# Patient Record
Sex: Male | Born: 1965 | Hispanic: Yes | Marital: Married | Smoking: Never smoker
Health system: Southern US, Community
[De-identification: ages and names within clinical notes are randomized; demographics above are authoritative.]

## PROBLEM LIST (undated history)

## (undated) DIAGNOSIS — E119 Type 2 diabetes mellitus without complications: Secondary | ICD-10-CM

## (undated) DIAGNOSIS — I10 Essential (primary) hypertension: Secondary | ICD-10-CM

## (undated) HISTORY — PX: APPENDECTOMY: SHX54

---

## 2013-06-25 ENCOUNTER — Encounter (HOSPITAL_COMMUNITY): Payer: Self-pay | Admitting: Emergency Medicine

## 2013-06-25 ENCOUNTER — Emergency Department (HOSPITAL_COMMUNITY)
Admission: EM | Admit: 2013-06-25 | Discharge: 2013-06-25 | Disposition: A | Discharge status: Home / Self Care | Payer: Medicaid Other | Attending: Emergency Medicine | Admitting: Emergency Medicine

## 2013-06-25 ENCOUNTER — Emergency Department (HOSPITAL_COMMUNITY): Payer: Medicaid Other

## 2013-06-25 DIAGNOSIS — S93409A Sprain of unspecified ligament of unspecified ankle, initial encounter: Secondary | ICD-10-CM | POA: Insufficient documentation

## 2013-06-25 DIAGNOSIS — X500XXA Overexertion from strenuous movement or load, initial encounter: Secondary | ICD-10-CM | POA: Insufficient documentation

## 2013-06-25 DIAGNOSIS — Y9301 Activity, walking, marching and hiking: Secondary | ICD-10-CM | POA: Insufficient documentation

## 2013-06-25 DIAGNOSIS — E119 Type 2 diabetes mellitus without complications: Secondary | ICD-10-CM | POA: Insufficient documentation

## 2013-06-25 DIAGNOSIS — S93401A Sprain of unspecified ligament of right ankle, initial encounter: Secondary | ICD-10-CM

## 2013-06-25 DIAGNOSIS — R296 Repeated falls: Secondary | ICD-10-CM | POA: Insufficient documentation

## 2013-06-25 DIAGNOSIS — Y9229 Other specified public building as the place of occurrence of the external cause: Secondary | ICD-10-CM | POA: Insufficient documentation

## 2013-06-25 HISTORY — DX: Type 2 diabetes mellitus without complications: E11.9

## 2013-06-25 MED ORDER — IBUPROFEN 800 MG PO TABS
800.0000 mg | ORAL_TABLET | Freq: Once | ORAL | Status: AC
  Administered 2013-06-25: 800 mg via ORAL
  Filled 2013-06-25: qty 1

## 2013-06-25 MED ORDER — IBUPROFEN 600 MG PO TABS: 600.0000 mg | ORAL_TABLET | Freq: Four times a day (QID) | ORAL | Status: DC | PRN

## 2013-06-25 NOTE — ED Notes (Signed)
Patient transported to X-ray 

## 2013-06-25 NOTE — ED Notes (Signed)
Pt was at a restaurant when pt rolled his right ankle. Pt c/o pain about 6/10 and worse with weight bearing. Right ankle does appear to be swollen and is larger than left ankle.

## 2013-06-25 NOTE — ED Provider Notes (Signed)
History    CSN: [id number] Arrival date & time 06/25/13  1011  First MD Initiated Contact with Patient 06/25/13 1012     Chief Complaint  Patient presents with  . Foot Injury  . Fall   (Consider location/radiation/quality/duration/timing/severity/associated sxs/prior Treatment) HPI Comments: 47 year old male presents for right ankle pain onset yesterday. Patient states that when he was walking he rolled his ankle last night at a restaurant. Patient woke up this morning with increased pain and swelling of his right ankle. Pain is worse with weightbearing and palpation and improved with rest and elevation. He admits to tingling sensation in his right ankle. No fevers, pallor, erythema, numbness, or extremity weakness.  Patient is a 47 y.o. male presenting with foot injury and fall. The history is provided by the patient. The history is limited by a language barrier. A language interpreter was used (daughter).  Foot Injury Location:  Ankle Time since incident:  2 days Injury: yes (patient rolled ankle while in a restaurant yesterday)   Ankle location:  R ankle Pain details:    Quality:  Aching   Radiates to:  Does not radiate   Severity:  Mild   Onset quality:  Gradual   Timing:  Intermittent   Progression:  Unchanged Chronicity:  New Dislocation: no   Prior injury to area:  No Relieved by:  Nothing Worsened by:  Bearing weight and flexion Ineffective treatments:  None tried Associated symptoms: swelling and tingling   Associated symptoms: no decreased ROM, no fever, no muscle weakness and no numbness   Fall Associated symptoms include arthralgias and joint swelling. Pertinent negatives include no fever, numbness or weakness.   Past Medical History  Diagnosis Date  . Diabetes mellitus without complication    Past Surgical History  Procedure Laterality Date  . Appendectomy     No family history on file. History  Substance Use Topics  . Smoking status: Never Smoker   .  Smokeless tobacco: Not on file  . Alcohol Use: Yes    Review of Systems  Constitutional: Negative for fever.  Musculoskeletal: Positive for joint swelling and arthralgias.  Skin: Negative for color change and pallor.  Neurological: Negative for weakness and numbness.  All other systems reviewed and are negative.    Allergies  Review of patient's allergies indicates no known allergies.  Home Medications   Current Outpatient Rx  Name  Route  Sig  Dispense  Refill  . ibuprofen (ADVIL,MOTRIN) 600 MG tablet   Oral   Take 1 tablet (600 mg total) by mouth every 6 (six) hours as needed for pain.   30 tablet   0    BP 149/73  Pulse 77  Temp(Src) 97.9 F (36.6 C) (Oral)  Resp 18  Ht 5\' 6"  (1.676 m)  SpO2 100%  Physical Exam  Nursing note and vitals reviewed. Constitutional: He is oriented to person, place, and time. He appears well-developed and well-nourished. No distress.  HENT:  Head: Normocephalic and atraumatic.  Eyes: Conjunctivae and EOM are normal. No scleral icterus.  Neck: Normal range of motion.  Cardiovascular: Normal rate, regular rhythm and intact distal pulses.   Dorsalis pedis and posterior tibial pulses 2+ bilaterally. Capillary refill normal.  Pulmonary/Chest: Effort normal. No respiratory distress.  Musculoskeletal:       Right ankle: He exhibits swelling. He exhibits normal range of motion, no ecchymosis, no laceration and normal pulse. Tenderness. Lateral malleolus tenderness found. Achilles tendon normal.       Right foot:  Normal.       Feet:  Neurological: He is alert and oriented to person, place, and time.  Skin: Skin is warm and dry. No rash noted. He is not diaphoretic. No erythema. No pallor.  Psychiatric: He has a normal mood and affect. His behavior is normal.    ED Course  Procedures (including critical care time) Labs Reviewed - No data to display Dg Ankle Complete Right  06/25/2013   *RADIOLOGY REPORT*  Clinical Data: Fall, right ankle  pain/swelling  RIGHT ANKLE - COMPLETE 3+ VIEW  Comparison: None.  Findings: No fracture or dislocation is seen.  The ankle mortise is intact.  Well corticated osseous density inferior to the lateral malleolus, not acute.  The base of the fifth metatarsal is unremarkable.  Small plantar and posterior calcaneal enthesophytes.  Mild lateral soft tissue swelling.  IMPRESSION: No fracture or dislocation is seen.  Mild lateral soft tissue swelling.   Original Report Authenticated By: Charline Bills, M.D.    1. Ankle sprain, right, initial encounter     MDM  Uncomplicated ankle sprain. Patient neurovascularly intact. Physical exam as above. There is no pallor, pulselessness, poikilothermia, or paresthesias to suspect complicating injury. No erythema or heat-to-touch to suspect underlying cellulitic or infectious joint process. X-ray without evidence of fracture or dislocation. ASO ankle applied in ED and crutches provided for weightbearing as tolerated. Patient appropriate for discharge with RICE instruction an orthopedic referral if symptoms do not improve in one week. Indications for return provided and patient agreeable to plan.     Antony Madura, PA-C 06/25/13 1121

## 2013-07-01 NOTE — ED Provider Notes (Signed)
Medical screening examination/treatment/procedure(s) were conducted as a shared visit with non-physician practitioner(s) or resident  and myself.  I personally evaluated the patient during the encounter and agree with the findings and plan unless otherwise indicated. Low risk injury.  Mild pain on exam. No fx on xray.   Enid Skeens, MD 07/01/13 2051

## 2013-07-18 ENCOUNTER — Encounter (HOSPITAL_COMMUNITY): Payer: Self-pay | Admitting: *Deleted

## 2013-07-18 DIAGNOSIS — E669 Obesity, unspecified: Secondary | ICD-10-CM | POA: Diagnosis present

## 2013-07-18 DIAGNOSIS — Z6841 Body Mass Index (BMI) 40.0 and over, adult: Secondary | ICD-10-CM

## 2013-07-18 DIAGNOSIS — E860 Dehydration: Secondary | ICD-10-CM | POA: Diagnosis present

## 2013-07-18 DIAGNOSIS — E1142 Type 2 diabetes mellitus with diabetic polyneuropathy: Secondary | ICD-10-CM | POA: Diagnosis present

## 2013-07-18 DIAGNOSIS — I129 Hypertensive chronic kidney disease with stage 1 through stage 4 chronic kidney disease, or unspecified chronic kidney disease: Secondary | ICD-10-CM | POA: Diagnosis present

## 2013-07-18 DIAGNOSIS — E1149 Type 2 diabetes mellitus with other diabetic neurological complication: Principal | ICD-10-CM | POA: Diagnosis present

## 2013-07-18 DIAGNOSIS — R079 Chest pain, unspecified: Secondary | ICD-10-CM | POA: Diagnosis present

## 2013-07-18 DIAGNOSIS — Z794 Long term (current) use of insulin: Secondary | ICD-10-CM

## 2013-07-18 DIAGNOSIS — N179 Acute kidney failure, unspecified: Secondary | ICD-10-CM | POA: Diagnosis present

## 2013-07-18 DIAGNOSIS — N390 Urinary tract infection, site not specified: Secondary | ICD-10-CM | POA: Diagnosis present

## 2013-07-18 DIAGNOSIS — B961 Klebsiella pneumoniae [K. pneumoniae] as the cause of diseases classified elsewhere: Secondary | ICD-10-CM | POA: Diagnosis present

## 2013-07-18 DIAGNOSIS — N189 Chronic kidney disease, unspecified: Secondary | ICD-10-CM | POA: Diagnosis present

## 2013-07-18 NOTE — ED Notes (Signed)
The pt is c/o some chest pressure and his tongue is numb tonight.  He is  A diabetic and they think his blood sugar is high but they have not checked it

## 2013-07-18 NOTE — ED Notes (Signed)
CBG checked 300

## 2013-07-19 ENCOUNTER — Emergency Department (HOSPITAL_COMMUNITY): Payer: Medicaid Other

## 2013-07-19 ENCOUNTER — Inpatient Hospital Stay (HOSPITAL_COMMUNITY)
Admission: EM | Admit: 2013-07-19 | Discharge: 2013-07-21 | DRG: 074 | Disposition: A | Discharge status: Home / Self Care | Payer: Medicaid Other | Attending: Family Medicine | Admitting: Family Medicine

## 2013-07-19 ENCOUNTER — Observation Stay (HOSPITAL_COMMUNITY): Payer: Medicaid Other

## 2013-07-19 DIAGNOSIS — E119 Type 2 diabetes mellitus without complications: Secondary | ICD-10-CM

## 2013-07-19 DIAGNOSIS — I10 Essential (primary) hypertension: Secondary | ICD-10-CM | POA: Diagnosis present

## 2013-07-19 DIAGNOSIS — D72829 Elevated white blood cell count, unspecified: Secondary | ICD-10-CM

## 2013-07-19 DIAGNOSIS — R072 Precordial pain: Secondary | ICD-10-CM

## 2013-07-19 DIAGNOSIS — N390 Urinary tract infection, site not specified: Secondary | ICD-10-CM

## 2013-07-19 DIAGNOSIS — R079 Chest pain, unspecified: Secondary | ICD-10-CM | POA: Diagnosis present

## 2013-07-19 HISTORY — DX: Essential (primary) hypertension: I10

## 2013-07-19 LAB — CBC WITH DIFFERENTIAL/PLATELET
Basophils Relative: 0 % (ref 0–1)
Eosinophils Relative: 1 % (ref 0–5)
Hemoglobin: 11.1 g/dL — ABNORMAL LOW (ref 13.0–17.0)
Lymphs Abs: 3.7 10*3/uL (ref 0.7–4.0)
MCV: 86.5 fL (ref 78.0–100.0)
Monocytes Relative: 9 % (ref 3–12)
Neutrophils Relative %: 72 % (ref 43–77)
Platelets: 222 10*3/uL (ref 150–400)
RBC: 3.64 MIL/uL — ABNORMAL LOW (ref 4.22–5.81)
WBC: 20.6 10*3/uL — ABNORMAL HIGH (ref 4.0–10.5)

## 2013-07-19 LAB — COMPREHENSIVE METABOLIC PANEL
ALT: 14 U/L (ref 0–53)
Alkaline Phosphatase: 73 U/L (ref 39–117)
BUN: 38 mg/dL — ABNORMAL HIGH (ref 6–23)
CO2: 25 mEq/L (ref 19–32)
GFR calc Af Amer: 48 mL/min — ABNORMAL LOW (ref >90)
GFR calc non Af Amer: 41 mL/min — ABNORMAL LOW (ref >90)
Glucose, Bld: 309 mg/dL — ABNORMAL HIGH (ref 70–99)
Potassium: 4.2 mEq/L (ref 3.5–5.1)
Sodium: 134 mEq/L — ABNORMAL LOW (ref 135–145)

## 2013-07-19 LAB — URINALYSIS, ROUTINE W REFLEX MICROSCOPIC
Bilirubin Urine: NEGATIVE
Ketones, ur: NEGATIVE mg/dL
Nitrite: NEGATIVE
Protein, ur: >300 mg/dL — AB
Urobilinogen, UA: 0.2 mg/dL (ref 0.0–1.0)

## 2013-07-19 LAB — BASIC METABOLIC PANEL
BUN: 36 mg/dL — ABNORMAL HIGH (ref 6–23)
CO2: 24 mEq/L (ref 19–32)
Calcium: 8.8 mg/dL (ref 8.4–10.5)
GFR calc non Af Amer: 47 mL/min — ABNORMAL LOW (ref >90)
Glucose, Bld: 301 mg/dL — ABNORMAL HIGH (ref 70–99)
Potassium: 4.2 mEq/L (ref 3.5–5.1)
Sodium: 137 mEq/L (ref 135–145)

## 2013-07-19 LAB — CBC
Hemoglobin: 10.8 g/dL — ABNORMAL LOW (ref 13.0–17.0)
MCH: 29.7 pg (ref 26.0–34.0)
MCHC: 34.3 g/dL (ref 30.0–36.0)
MCV: 86.5 fL (ref 78.0–100.0)
RBC: 3.64 MIL/uL — ABNORMAL LOW (ref 4.22–5.81)

## 2013-07-19 LAB — URINE MICROSCOPIC-ADD ON

## 2013-07-19 LAB — GLUCOSE, CAPILLARY
Glucose-Capillary: 272 mg/dL — ABNORMAL HIGH (ref 70–99)
Glucose-Capillary: 286 mg/dL — ABNORMAL HIGH (ref 70–99)
Glucose-Capillary: 252 mg/dL — ABNORMAL HIGH (ref 70–99)

## 2013-07-19 LAB — TROPONIN I: Troponin I: <0.3 ng/mL (ref <0.30)

## 2013-07-19 MED ORDER — LISINOPRIL 20 MG PO TABS
20.0000 mg | ORAL_TABLET | Freq: Once | ORAL | Status: AC
  Administered 2013-07-19: 20 mg via ORAL
  Filled 2013-07-19: qty 1

## 2013-07-19 MED ORDER — ONDANSETRON HCL 4 MG PO TABS: 4.0000 mg | ORAL_TABLET | Freq: Four times a day (QID) | ORAL | Status: DC | PRN

## 2013-07-19 MED ORDER — ACETAMINOPHEN 325 MG PO TABS: 650.0000 mg | ORAL_TABLET | ORAL | Status: DC | PRN

## 2013-07-19 MED ORDER — INSULIN GLARGINE 100 UNIT/ML ~~LOC~~ SOLN
28.0000 [IU] | Freq: Every day | SUBCUTANEOUS | Status: DC
  Administered 2013-07-19 – 2013-07-20 (×2): 28 [IU] via SUBCUTANEOUS
  Filled 2013-07-19 (×3): qty 0.28

## 2013-07-19 MED ORDER — AZITHROMYCIN 500 MG IV SOLR
500.0000 mg | INTRAVENOUS | Status: DC
  Administered 2013-07-19: 500 mg via INTRAVENOUS
  Filled 2013-07-19 (×2): qty 500

## 2013-07-19 MED ORDER — SODIUM CHLORIDE 0.9 % IJ SOLN
3.0000 mL | Freq: Two times a day (BID) | INTRAMUSCULAR | Status: DC
  Administered 2013-07-19 – 2013-07-20 (×4): 3 mL via INTRAVENOUS

## 2013-07-19 MED ORDER — ALUM & MAG HYDROXIDE-SIMETH 200-200-20 MG/5ML PO SUSP
30.0000 mL | Freq: Four times a day (QID) | ORAL | Status: DC | PRN
  Administered 2013-07-21 (×2): 30 mL via ORAL
  Filled 2013-07-19 (×2): qty 30

## 2013-07-19 MED ORDER — ASPIRIN EC 81 MG PO TBEC
81.0000 mg | DELAYED_RELEASE_TABLET | Freq: Every day | ORAL | Status: DC
  Administered 2013-07-19 – 2013-07-21 (×3): 81 mg via ORAL
  Filled 2013-07-19 (×3): qty 1

## 2013-07-19 MED ORDER — ONDANSETRON HCL 4 MG/2ML IJ SOLN: 4.0000 mg | Freq: Four times a day (QID) | INTRAMUSCULAR | Status: DC | PRN

## 2013-07-19 MED ORDER — SODIUM CHLORIDE 0.9 % IV BOLUS (SEPSIS)
1000.0000 mL | Freq: Once | INTRAVENOUS | Status: AC
  Administered 2013-07-19: 1000 mL via INTRAVENOUS

## 2013-07-19 MED ORDER — INSULIN ASPART 100 UNIT/ML ~~LOC~~ SOLN
0.0000 [IU] | Freq: Three times a day (TID) | SUBCUTANEOUS | Status: DC
  Administered 2013-07-19: 5 [IU] via SUBCUTANEOUS
  Administered 2013-07-19: 3 [IU] via SUBCUTANEOUS
  Administered 2013-07-19 – 2013-07-20 (×2): 5 [IU] via SUBCUTANEOUS
  Administered 2013-07-20: 7 [IU] via SUBCUTANEOUS
  Administered 2013-07-20 – 2013-07-21 (×3): 3 [IU] via SUBCUTANEOUS

## 2013-07-19 MED ORDER — GABAPENTIN 300 MG PO CAPS
300.0000 mg | ORAL_CAPSULE | Freq: Every day | ORAL | Status: DC
  Administered 2013-07-19: 300 mg via ORAL
  Filled 2013-07-19 (×2): qty 1

## 2013-07-19 MED ORDER — DEXTROSE 5 % IV SOLN
1.0000 g | Freq: Once | INTRAVENOUS | Status: AC
  Administered 2013-07-19: 1 g via INTRAVENOUS
  Filled 2013-07-19: qty 10

## 2013-07-19 MED ORDER — LEVOFLOXACIN IN D5W 500 MG/100ML IV SOLN
500.0000 mg | Freq: Every day | INTRAVENOUS | Status: DC
  Administered 2013-07-19 – 2013-07-20 (×2): 500 mg via INTRAVENOUS
  Filled 2013-07-19 (×4): qty 100

## 2013-07-19 MED ORDER — INSULIN ASPART 100 UNIT/ML ~~LOC~~ SOLN
4.0000 [IU] | Freq: Once | SUBCUTANEOUS | Status: AC
  Administered 2013-07-19: 4 [IU] via SUBCUTANEOUS

## 2013-07-19 NOTE — Progress Notes (Signed)
Inpatient Diabetes Program Recommendations  AACE/ADA: New Consensus Statement on Inpatient Glycemic Control (2013)  Target Ranges:  Prepandial:   less than 140 mg/dL      Peak postprandial:   less than 180 mg/dL (1-2 hours)      Critically ill patients:  140 - 180 mg/dL     Results for ERON, STAAT (MRN [id number]) as of 07/19/2013 12:17  Ref. Range 07/18/2013 23:56 07/19/2013 06:03 07/19/2013 11:16  Glucose-Capillary Latest Range: 70-99 mg/dL 962 (H) 952 (H) 841 (H)     **Admitted with CP.  Has history of Type 2 diabetes.  **Takes the following DM medications at home: Metformin 850 mg tid Glipizide 10 mg daily Victoza 1.8 mg SQ daily  **Glucose levels elevated >250 mg/dl.  If this trend continues, may want to add basal insulin to regimen- Recommend Levemir 20 units QHS.  Noted A1c has been ordered.  Waiting for results to come back.   Will follow while inpatient. Ambrose Finland RN, MSN, CDE Diabetes Coordinator Inpatient Diabetes Program [phone]

## 2013-07-19 NOTE — ED Provider Notes (Signed)
CSN: [id number]     Arrival date & time 07/18/13  2326 History     First MD Initiated Contact with Patient 07/19/13 0242     Chief Complaint  Patient presents with  . Chest Pain   (Consider location/radiation/quality/duration/timing/severity/associated sxs/prior Treatment) Patient is a 47 y.o. male presenting with chest pain. The history is provided by the patient.  Chest Pain Pain location:  L chest Pain quality: dull and sharp   Pain radiates to:  Does not radiate Pain severity:  Moderate Onset quality:  Gradual Duration:  5 hours Timing:  Constant Progression:  Unchanged Chronicity:  New Context: at rest   Relieved by:  Nothing Worsened by:  Nothing tried Ineffective treatments:  None tried Associated symptoms: cough and fever   Associated symptoms comment:  Felt febrile  At home, has also had dysuria and tongue numbness Cough:    Cough characteristics:  Non-productive   Onset quality:  Sudden Risk factors: diabetes mellitus, hypertension, male sex and obesity     Past Medical History  Diagnosis Date  . Diabetes mellitus without complication   . Hypertension    Past Surgical History  Procedure Laterality Date  . Appendectomy     No family history on file. History  Substance Use Topics  . Smoking status: Never Smoker   . Smokeless tobacco: Not on file  . Alcohol Use: Yes    Review of Systems  Constitutional: Positive for fever.  Respiratory: Positive for cough. Negative for wheezing.   Cardiovascular: Positive for chest pain and leg swelling.  Genitourinary: Positive for dysuria.  All other systems reviewed and are negative.    Allergies  Review of patient's allergies indicates no known allergies.  Home Medications   Current Outpatient Rx  Name  Route  Sig  Dispense  Refill  . glipiZIDE (GLUCOTROL) 10 MG tablet   Oral   Take 10 mg by mouth daily.         . Liraglutide (VICTOZA) 18 MG/3ML SOPN   Subcutaneous   Inject 1.8 mg into the skin  daily.          . metFORMIN (GLUCOPHAGE) 850 MG tablet   Oral   Take 850 mg by mouth 3 (three) times daily.         . QUINAPRIL HCL PO   Oral   Take 1 tablet by mouth daily.          BP 137/71  Pulse 71  Temp(Src) 99 F (37.2 C) (Oral)  Resp 14  SpO2 99% Physical Exam  Constitutional: He is oriented to person, place, and time. He appears well-developed and well-nourished. No distress.  HENT:  Head: Normocephalic and atraumatic.  Mouth/Throat: Oropharynx is clear and moist.  Eyes: Conjunctivae are normal. Pupils are equal, round, and reactive to light.  Neck: Normal range of motion. Neck supple.  Cardiovascular: Normal rate and regular rhythm.   Pulmonary/Chest: Effort normal and breath sounds normal. He has no wheezes. He has no rales.  Abdominal: Soft. Bowel sounds are normal. There is tenderness. There is no rebound and no guarding.  Musculoskeletal: Normal range of motion.  Neurological: He is alert and oriented to person, place, and time. He has normal reflexes. No cranial nerve deficit.  Skin: Skin is warm and dry.  Psychiatric: He has a normal mood and affect.    ED Course   Procedures (including critical care time)  Labs Reviewed  CBC WITH DIFFERENTIAL - Abnormal; Notable for the following:    WBC  20.6 (*)    RBC 3.64 (*)    Hemoglobin 11.1 (*)    HCT 31.5 (*)    Neutro Abs 14.8 (*)    Monocytes Absolute 1.9 (*)    All other components within normal limits  COMPREHENSIVE METABOLIC PANEL - Abnormal; Notable for the following:    Sodium 134 (*)    Glucose, Bld 309 (*)    BUN 38 (*)    Creatinine, Ser 1.87 (*)    Albumin 2.7 (*)    GFR calc non Af Amer 41 (*)    GFR calc Af Amer 48 (*)    All other components within normal limits  URINALYSIS, ROUTINE W REFLEX MICROSCOPIC - Abnormal; Notable for the following:    APPearance CLOUDY (*)    Glucose, UA 250 (*)    Hgb urine dipstick TRACE (*)    Protein, ur >300 (*)    Leukocytes, UA SMALL (*)    All  other components within normal limits  GLUCOSE, CAPILLARY - Abnormal; Notable for the following:    Glucose-Capillary 300 (*)    All other components within normal limits  URINE MICROSCOPIC-ADD ON - Abnormal; Notable for the following:    Bacteria, UA MANY (*)    Casts HYALINE CASTS (*)    All other components within normal limits  URINE CULTURE  POCT I-STAT TROPONIN I   Dg Chest 2 View  07/19/2013   *RADIOLOGY REPORT*  Clinical Data: Chest pain and chest pressure.  CHEST - 2 VIEW  Comparison: No priors.  Findings: Lung volumes are low.  No acute consolidative air space disease.  No pleural effusions.  No evidence of pulmonary edema. Heart size is borderline enlarged. The patient is rotated to the right on today's exam, resulting in distortion of the mediastinal contours and reduced diagnostic sensitivity and specificity for mediastinal pathology.  IMPRESSION: 1.  Low lung volumes without radiographic evidence of acute cardiopulmonary disease.   Original Report Authenticated By: Trudie Reed, M.D.   No diagnosis found.  MDM   Date: 07/19/2013  Rate: 73  Rhythm: normal sinus rhythm  QRS Axis: normal  Intervals: normal  ST/T Wave abnormalities: nonspecific T wave changes  Conduction Disutrbances:none  Narrative Interpretation:   Old EKG Reviewed: none available   Chest pain rule out with leukocytosis.  Likely from UTI but given dehydration pattern of urine will need repeat CXR    Lerin Jech K Tytus Strahle-Rasch, MD 07/19/13 [phone]

## 2013-07-19 NOTE — H&P (Signed)
Chief Complaint:  Chest pain  HPI: 47 yo male with sscp that has occurred 3 times today brief lasts less than 5 minutes no radiation, no association with cough or deep inspiration, no n/v.  Has also been running fever today and having dysuria.  No cough.  Some le swelling on/off but none today.  He has had dm for 12 years and is poorly controlled.  He is starting to have some numbness in both of his feet.  No abd pain.  No problems moving arms /legs.  No sob.  He does not know of any kidney problems that he knows of.  No flank pain.  Review of Systems:  Positive and negative as per HPI otherwise all other systems are negative  Past Medical History: Past Medical History  Diagnosis Date  . Diabetes mellitus without complication   . Hypertension    Past Surgical History  Procedure Laterality Date  . Appendectomy      Medications: Prior to Admission medications   Medication Sig Start Date End Date Taking? Authorizing Provider  glipiZIDE (GLUCOTROL) 10 MG tablet Take 10 mg by mouth daily.   Yes Historical Provider, MD  Liraglutide (VICTOZA) 18 MG/3ML SOPN Inject 1.8 mg into the skin daily.    Yes Historical Provider, MD  metFORMIN (GLUCOPHAGE) 850 MG tablet Take 850 mg by mouth 3 (three) times daily.   Yes Historical Provider, MD  QUINAPRIL HCL PO Take 1 tablet by mouth daily.   Yes Historical Provider, MD    Allergies:  No Known Allergies  Social History:  reports that he has never smoked. He does not have any smokeless tobacco history on file. He reports that  drinks alcohol. His drug history is not on file.  Family History: none  Physical Exam: Filed Vitals:   07/19/13 0229  BP: 137/71  Pulse: 71  Temp: 99 F (37.2 C)  TempSrc: Oral  Resp: 14  SpO2: 99%   General appearance: alert, cooperative and no distress Head: Normocephalic, without obvious abnormality, atraumatic Eyes: negative Nose: Nares normal. Septum midline. Mucosa normal. No drainage or sinus  tenderness. Neck: no JVD and supple, symmetrical, trachea midline Lungs: clear to auscultation bilaterally Heart: regular rate and rhythm, S1, S2 normal, no murmur, click, rub or gallop Abdomen: soft, non-tender; bowel sounds normal; no masses,  no organomegaly Extremities: extremities normal, atraumatic, no cyanosis or edema Pulses: 2+ and symmetric Skin: Skin color, texture, turgor normal. No rashes or lesions Neurologic: Grossly normal    Labs on Admission:   Recent Labs  07/18/13 2343  NA 134*  K 4.2  CL 98  CO2 25  GLUCOSE 309*  BUN 38*  CREATININE 1.87*  CALCIUM 8.8    Recent Labs  07/18/13 2343  AST 14  ALT 14  ALKPHOS 73  BILITOT 0.3  PROT 6.7  ALBUMIN 2.7*    Recent Labs  07/18/13 2343  WBC 20.6*  NEUTROABS 14.8*  HGB 11.1*  HCT 31.5*  MCV 86.5  PLT 222     Radiological Exams on Admission: Dg Chest 2 View  07/19/2013   *RADIOLOGY REPORT*  Clinical Data: Chest pain and chest pressure.  CHEST - 2 VIEW  Comparison: No priors.  Findings: Lung volumes are low.  No acute consolidative air space disease.  No pleural effusions.  No evidence of pulmonary edema. Heart size is borderline enlarged. The patient is rotated to the right on today's exam, resulting in distortion of the mediastinal contours and reduced diagnostic sensitivity and specificity  for mediastinal pathology.  IMPRESSION: 1.  Low lung volumes without radiographic evidence of acute cardiopulmonary disease.   Original Report Authenticated By: Trudie Reed, M.D.    Assessment/Plan  47 yo male with uncontrolled dm, atypical cp, and uti Principal Problem:   Chest pain Active Problems:   Diabetes mellitus without complication   Hypertension   UTI (lower urinary tract infection)  Cr is elevated, unknown what his baseline is.  Nontoxic appearing.  obs for his chest pain, romi, asa, tele monitoring and echo in am.  ekg nsr no acute changes.  Place on levaquin.  Bolus one liter ivf and repeat  cr in am, suspect he has chronic component kidney disease.  Place on ssi.  obs on tele.  Full code.    Latravious Levitt A 07/19/2013, 4:05 AM

## 2013-07-19 NOTE — Progress Notes (Signed)
Utilization review completed.  

## 2013-07-19 NOTE — Progress Notes (Addendum)
Patient admitted this morning  Subjective: Patient seen and examined, admitted this morning with chest pain, complains of numbness of the lower both feet. He has longstanding history of DM.  Filed Vitals:   07/19/13 1505  BP: 142/64  Pulse: 68  Temp: 98.2 F (36.8 C)  Resp: 18    Chest: Clear Bilaterally Heart : S1S2 RRR Abdomen: Soft, nontender Ext : No edema Feet; Reduced sensitivity on monofilament testing Neuro: Alert, oriented x 3  A/P Diabetic neuropathy Chest pain UTI AKI  Will start neurontin for neuropathic pain, continue to follow the cardiac enzymes, will obtain renal ultrasound for AKI.will add Zithromax for possible bronchitis    Meredeth Ide [facility] [phone]

## 2013-07-19 NOTE — Progress Notes (Signed)
  Echocardiogram 2D Echocardiogram has been performed.  Phillip Murphy [name] 07/19/2013, 10:43 AM

## 2013-07-19 NOTE — ED Notes (Signed)
Patient transported to X-ray 

## 2013-07-20 DIAGNOSIS — N390 Urinary tract infection, site not specified: Secondary | ICD-10-CM

## 2013-07-20 DIAGNOSIS — R079 Chest pain, unspecified: Secondary | ICD-10-CM

## 2013-07-20 DIAGNOSIS — I10 Essential (primary) hypertension: Secondary | ICD-10-CM

## 2013-07-20 DIAGNOSIS — D72829 Elevated white blood cell count, unspecified: Secondary | ICD-10-CM

## 2013-07-20 DIAGNOSIS — E119 Type 2 diabetes mellitus without complications: Secondary | ICD-10-CM

## 2013-07-20 LAB — URINE CULTURE: Colony Count: >=100000

## 2013-07-20 LAB — GLUCOSE, CAPILLARY
Glucose-Capillary: 217 mg/dL — ABNORMAL HIGH (ref 70–99)
Glucose-Capillary: 263 mg/dL — ABNORMAL HIGH (ref 70–99)
Glucose-Capillary: 267 mg/dL — ABNORMAL HIGH (ref 70–99)

## 2013-07-20 LAB — BASIC METABOLIC PANEL
Calcium: 9.1 mg/dL (ref 8.4–10.5)
GFR calc Af Amer: 79 mL/min — ABNORMAL LOW (ref >90)
GFR calc non Af Amer: 68 mL/min — ABNORMAL LOW (ref >90)
Potassium: 4.2 mEq/L (ref 3.5–5.1)
Sodium: 136 mEq/L (ref 135–145)

## 2013-07-20 LAB — CBC
MCH: 30.4 pg (ref 26.0–34.0)
Platelets: 246 10*3/uL (ref 150–400)
RBC: 3.68 MIL/uL — ABNORMAL LOW (ref 4.22–5.81)
WBC: 12.3 10*3/uL — ABNORMAL HIGH (ref 4.0–10.5)

## 2013-07-20 MED ORDER — ENOXAPARIN SODIUM 40 MG/0.4ML ~~LOC~~ SOLN
40.0000 mg | SUBCUTANEOUS | Status: DC
  Administered 2013-07-20 – 2013-07-21 (×2): 40 mg via SUBCUTANEOUS
  Filled 2013-07-20 (×2): qty 0.4

## 2013-07-20 MED ORDER — AMLODIPINE BESYLATE 10 MG PO TABS
10.0000 mg | ORAL_TABLET | Freq: Every day | ORAL | Status: DC
  Administered 2013-07-20 – 2013-07-21 (×2): 10 mg via ORAL
  Filled 2013-07-20 (×2): qty 1

## 2013-07-20 MED ORDER — LISINOPRIL 10 MG PO TABS
10.0000 mg | ORAL_TABLET | Freq: Every day | ORAL | Status: DC
  Administered 2013-07-20 – 2013-07-21 (×2): 10 mg via ORAL
  Filled 2013-07-20 (×2): qty 1

## 2013-07-20 MED ORDER — GABAPENTIN 300 MG PO CAPS
300.0000 mg | ORAL_CAPSULE | Freq: Two times a day (BID) | ORAL | Status: DC
Start: 2013-07-20 — End: 2013-07-21
  Administered 2013-07-20 – 2013-07-21 (×3): 300 mg via ORAL
  Filled 2013-07-20 (×4): qty 1

## 2013-07-20 NOTE — Progress Notes (Signed)
Inpatient Diabetes Program Recommendations  AACE/ADA: New Consensus Statement on Inpatient Glycemic Control (2013)  Target Ranges:  Prepandial:   less than 140 mg/dL      Peak postprandial:   less than 180 mg/dL (1-2 hours)      Critically ill patients:  140 - 180 mg/dL     Results for Phillip Murphy, Phillip Murphy (MRN [id number]) as of 07/20/2013 13:56  Ref. Range 07/20/2013 07:40 07/20/2013 11:07  Glucose-Capillary Latest Range: 70-99 mg/dL 409 (H) 811 (H)    Results for Phillip Murphy, Phillip Murphy (MRN [id number]) as of 07/20/2013 13:56  Ref. Range 07/19/2013 06:33  Hemoglobin A1C Latest Range: <5.7 % 10.1 (H)    **Spoke with patient in limited Spanish and also in [place] (patient understands some English).  Patient was able to tell me that [facility] for [facility]CP and that he has been taking the same DM regimen for 3 years now.  Patient stated he has concerns b/c his CBGs are always elevated at home.  Does not have any problems taking his medications (Glipizide, Metformin, and Victoza).  Per records, patient has Medicaid and should be able to get his meds for $3.  Patient also tried to tell me that he is unsure what to eat at home.  Determined that patient does not understand what carbohydrates are and what foods contain carbohydrates.  Patient reports that he eats 5-6 tortillas and does not measure his food.  Did not realize that he should monitor and measure his consumption of carbohydrates.  Attempted to explain to patient what foods have carbohydrates and also about portion sizes.  Will ask dietitian to come see patient for further diet instruction.  Will also give patient a carbohydrate counting guide in Spanish.  Patient very receptive to all the information.  Expect good compliance with further instruction.   **MD- Noted Lantus 28 units QHS was started last night.  Do you think you will d/c patient home on Lantus?  Will follow. Ambrose Finland RN, MSN, CDE Diabetes  Coordinator Inpatient Diabetes Program [phone]

## 2013-07-20 NOTE — Progress Notes (Signed)
Nutrition Brief Note  RD consulted for diet education re: diabetes. Pt speaks very limited [place]. RD has arranged for in-person Spanish-interpreter to assist with education tomorrow morning at 10:30 am.   RD provided patient with written "Carbohydrate Counting for People with Diabetes" handout in Spanish at this time in case pt discharges prior to RD follow-up tomorrow; pt verbalized understanding.   Jarold Motto MS, RD, LDN Pager: [phone] After-hours pager: [phone]

## 2013-07-20 NOTE — Progress Notes (Signed)
[facility] PROGRESS NOTE  Phillip Murphy WUJ:[id number] DOB: 1966-01-14 DOA: 07/19/2013 PCP: Provider Not In System  Assessment/Plan: 1. Chest pain- resolved, cardiac enzymes x 3 are negative.Echo shows trhe EF55-60%, and showed no regional wall motion abnormalities. 2. UTI- urine culture is growing GNR, the final sensitivities are pending.will continue with emperic levaquin at this time. 3. Hypertension- BP elevated, will start Lisnopril and amlodipine, will closely monitor the BP. 4. Diabetes mellitus- continue SSI, and lantus. Will get Diabetes coordinator consult as hba1c is elevated. 5. Peripheral neuropathy- sec to diabetes, will increase the dose of neurontin to 300 mg po BID. 6. DVT prophylaxis- Lovenox  Code Status: Full code Family Communication: *Discussed with wife at the bedside Disposition Plan: *Home when stable   Consultants: None  Procedures: None  Antibiotics: Levaquin 8/13 >>  HPI/Subjective: Patient seen and examined, admitted with chest pain.no pain at this time, cardiac enzymes are negative. Urine culture is growing GNR, sensitivity is pending at this time.  Objective: Filed Vitals:   07/20/13 0622  BP: 159/84  Pulse: 65  Temp: 98.1 F (36.7 C)  Resp: 18    Intake/Output Summary (Last 24 hours) at 07/20/13 1211 Last data filed at 07/19/13 2100  Gross per 24 hour  Intake    240 ml  Output      0 ml  Net    240 ml   Filed Weights   07/19/13 0541  Weight: 115.803 kg (255 lb 4.8 oz)    Exam:   General: Appear in no acute distress  Cardiovascular: S1s2 RRR  Respiratory: Clear bilaterally  Abdomen: Soft, nontender  Musculoskeletal: No edema of the lower extremities  Data Reviewed: Basic Metabolic Panel:  Recent Labs Lab 07/18/13 2343 07/19/13 0633 07/20/13 0826  NA 134* 137 136  K 4.2 4.2 4.2  CL 98 104 102  CO2 25 24 26   GLUCOSE 309* 301* 241*  BUN 38* 36* 23  CREATININE 1.87* 1.68* 1.24  CALCIUM 8.8 8.8 9.1   Liver  Function Tests:  Recent Labs Lab 07/18/13 2343  AST 14  ALT 14  ALKPHOS 73  BILITOT 0.3  PROT 6.7  ALBUMIN 2.7*   No results found for this basename: LIPASE, AMYLASE,  in the last 168 hours No results found for this basename: AMMONIA,  in the last 168 hours CBC:  Recent Labs Lab 07/18/13 2343 07/19/13 0633 07/20/13 0826  WBC 20.6* 19.7* 12.3*  NEUTROABS 14.8*  --   --   HGB 11.1* 10.8* 11.2*  HCT 31.5* 31.5* 31.5*  MCV 86.5 86.5 85.6  PLT 222 213 246   Cardiac Enzymes:  Recent Labs Lab 07/19/13 0600  TROPONINI <0.30   BNP (last 3 results) No results found for this basename: PROBNP,  in the last 8760 hours CBG:  Recent Labs Lab 07/19/13 1642 07/19/13 2108 07/19/13 2226 07/20/13 0740 07/20/13 1107  GLUCAP 226* 286* 252* 217* 263*    Recent Results (from the past 240 hour(s))  URINE CULTURE     Status: None   Collection Time    07/19/13 12:20 AM      Result Value Range Status   Specimen Description URINE, CLEAN CATCH   Final   Special Requests NONE   Final   Culture  Setup Time     Final   Value: 07/19/2013 09:19     Performed at [facility] Count     Final   Value: >=100,000 COLONIES/ML     Performed at [facility]   Culture     Final   Value: GRAM NEGATIVE RODS     Performed at [facility]   Report Status PENDING   Incomplete     Studies: Dg Chest 2 View  07/19/2013   *RADIOLOGY REPORT*  Clinical Data: Chest pain and chest pressure.  CHEST - 2 VIEW  Comparison: No priors.  Findings: Lung volumes are low.  No acute consolidative air space disease.  No pleural effusions.  No evidence of pulmonary edema. Heart size is borderline enlarged. The patient is rotated to the right on today's exam, resulting in distortion of the mediastinal contours and reduced diagnostic sensitivity and specificity for mediastinal pathology.  IMPRESSION: 1.  Low lung volumes without radiographic evidence of acute cardiopulmonary disease.    Original Report Authenticated By: Trudie Reed, M.D.   US Renal  07/19/2013   *RADIOLOGY REPORT*  Clinical Data: Renal failure  RENAL/URINARY TRACT ULTRASOUND COMPLETE  Comparison:  None.  Findings:  Right Kidney:  Normal reniform contour without hydronephrosis or nephrolithiasis.  The renal parenchyma is mildly echogenic.  The kidney measures 11.9 cm in length which is within normal limits.  Left Kidney:  Normal reniform contour without hydronephrosis or nephrolithiasis.  The renal parenchyma is mildly echogenic.  The kidney measures 12.2 cm in length which is within normal limits.  Bladder:  The bladder is normal in appearance.  IMPRESSION:  1.  No evidence of hydronephrosis. 2.  Mildly echogenic renal parenchyma bilaterally suggests underlying medical renal disease.   Original Report Authenticated By: Malachy Moan, M.D.    Scheduled Meds: . amLODipine  10 mg Oral Daily  . aspirin EC  81 mg Oral Daily  . azithromycin  500 mg Intravenous Q24H  . gabapentin  300 mg Oral BID  . insulin aspart  0-9 Units Subcutaneous TID WC  . insulin glargine  28 Units Subcutaneous QHS  . levofloxacin (LEVAQUIN) IV  500 mg Intravenous Daily  . lisinopril  10 mg Oral Daily  . sodium chloride  3 mL Intravenous Q12H   Continuous Infusions:   Principal Problem:   Chest pain Active Problems:   Diabetes mellitus without complication   Hypertension   UTI (lower urinary tract infection)    Time spent: 30 min    [facility]  [facility] Pager [phone]. If 7PM-7AM, please contact night-coverage at [email or web address], password [email or web address] 07/20/2013, 12:11 PM  LOS: 1 day

## 2013-07-21 LAB — BASIC METABOLIC PANEL
Calcium: 9.1 mg/dL (ref 8.4–10.5)
GFR calc Af Amer: 73 mL/min — ABNORMAL LOW (ref >90)
GFR calc non Af Amer: 63 mL/min — ABNORMAL LOW (ref >90)
Glucose, Bld: 224 mg/dL — ABNORMAL HIGH (ref 70–99)
Sodium: 137 mEq/L (ref 135–145)

## 2013-07-21 LAB — CBC
MCH: 29.6 pg (ref 26.0–34.0)
MCHC: 34.5 g/dL (ref 30.0–36.0)
Platelets: 250 10*3/uL (ref 150–400)
RBC: 3.68 MIL/uL — ABNORMAL LOW (ref 4.22–5.81)

## 2013-07-21 LAB — GLUCOSE, CAPILLARY: Glucose-Capillary: 202 mg/dL — ABNORMAL HIGH (ref 70–99)

## 2013-07-21 MED ORDER — LEVOFLOXACIN 500 MG PO TABS: 500.0000 mg | ORAL_TABLET | Freq: Every day | ORAL | Status: DC

## 2013-07-21 MED ORDER — AMLODIPINE BESYLATE 10 MG PO TABS: 10.0000 mg | ORAL_TABLET | Freq: Every day | ORAL | Status: DC

## 2013-07-21 MED ORDER — LISINOPRIL 20 MG PO TABS: 10.0000 mg | ORAL_TABLET | Freq: Every day | ORAL | Status: DC

## 2013-07-21 MED ORDER — GABAPENTIN 300 MG PO CAPS: 300.0000 mg | ORAL_CAPSULE | Freq: Three times a day (TID) | ORAL | Status: DC

## 2013-07-21 NOTE — Progress Notes (Signed)
Pt just d/c'd off unit at this time 1538. Pt was removed from computer by mistake and room started to be cleaned while pt walked up to desk.      D/c instructions reviewed with pt and his wife. Information given on a more affordable glucometer "Reli on" meter and affordable strips which can be purchased at [facility], pt has not been checking his bloodsugar at home, instructed to start checking them at least daily.  Multiple diabetes instructions /information printed out for pt in [place]. Reviewed with pt and wife. Copy of instructions, scripts and all education handouts given to pt. Pt able to understand information, wife speaks fluent [place] and [place]. Pt d/c'd with belongings, with family, ambulated out, pt declined wheelchair, has steady gait.   Pt and wife states pt sees a Dr Mayford Knife at  [facility] on [address] here in [place]. Instructed them to make f/u appointment in 2 weeks as per MD instructions and to take d/c instructions with them to this appointment. Both verbalized understanding.

## 2013-07-21 NOTE — Discharge Summary (Signed)
Physician Discharge Summary  Neev Mcmains ZOX:[id number] DOB: 06/22/1966 DOA: 07/19/2013  PCP: Provider Not In System  Admit date: 07/19/2013 Discharge date: 07/21/2013  Time spent: 50* minutes  Recommendations for Outpatient Follow-up:  1. *Follow up PCP in 2 weeks 2. Will need outpatient cardiac stress test  Discharge Diagnoses:  Principal Problem:   Chest pain Active Problems:   Diabetes mellitus without complication   Hypertension   UTI (lower urinary tract infection)   Discharge Condition: Stable  Diet recommendation: carb modified diet  Filed Weights   07/19/13 0541 07/21/13 0540  Weight: 115.803 kg (255 lb 4.8 oz) 115.531 kg (254 lb 11.2 oz)    History of present illness:  47 yo male with sscp that has occurred 3 times today brief lasts less than 5 minutes no radiation, no association with cough or deep inspiration, no n/v. Has also been running fever today and having dysuria. No cough. Some le swelling on/off but none today. He has had dm for 12 years and is poorly controlled. He is starting to have some numbness in both of his feet. No abd pain. No problems moving arms /legs. No sob. He does not know of any kidney problems that he knows of. No flank pain.   Hospital Course:  Chest pain- resolved at this time, cardiac enzymes time 3 of negative. 2-D echo did not show any wall motion abnormality. Ejection fraction 55-60%. Patient will need outpatient cardiac stress test her to multiple risk factors including obesity, diabetes uncontrolled, hypertension. Most likely was of chest pain was musculoskeletal.  Uncontrolled diabetes mellitus- patient's hemoglobin A1c is 10.1. He was seen by diabetes coordinator and also dietitian was consulted to educate the patient for carb modified diet and calorie count. Patient will be continued on the same regimen of Lantus, Glucophage, Glucotrol at home. He is to followup with his primary care provider for further management of diabetes  mellitus  UTI- urine culture grew Klebsiella which is sensitive to Levaquin. Patient will be discharged on Levaquin for 10 more days.  Hypertension-patient's blood pressure was uncontrolled so we added amlodipine 10 mg by mouth daily. The does of lisinopril has been cut down from 20 mg to 10 mg by mouth daily due to worsening renal function.  Chronic kidney disease- renal ultrasound showed changes consistent with medical renal disease. At this time patient's creatinine is stable. He is to be followed up as outpatient by nephrologist.   Diabetic neuropathy- patient complained of numbness of both the feet and was started on Neurontin with some improvement. Patient will be continued on Neurontin 300 mg by mouth 3 times a day. He is to followup with her primary care provider for further management    Procedures:  2-D echocardiogram  Consultations:  None  Discharge Exam: Filed Vitals:   07/21/13 0540  BP: 139/60  Pulse: 62  Temp: 98.3 F (36.8 C)  Resp:     General: Appear in no acute distress  Cardiovascular: S1-S2 regular  Respiratory: Clear bilaterally Abdomen: Soft nontender Extremities: No edema  Discharge Instructions  Discharge Orders   Future Orders Complete By Expires   Diet Carb Modified  As directed    Increase activity slowly  As directed        Medication List         amLODipine 10 MG tablet  Commonly known as:  NORVASC  Take 1 tablet (10 mg total) by mouth daily.     gabapentin 300 MG capsule  Commonly known as:  NEURONTIN  Take 1 capsule (300 mg total) by mouth 3 (three) times daily.     glipiZIDE 10 MG tablet  Commonly known as:  GLUCOTROL  Take 10 mg by mouth daily.     insulin glargine 100 UNIT/ML injection  Commonly known as:  LANTUS  Inject 28 Units into the skin at bedtime.     levofloxacin 500 MG tablet  Commonly known as:  LEVAQUIN  Take 1 tablet (500 mg total) by mouth daily.     lisinopril 20 MG tablet  Commonly known as:   PRINIVIL,ZESTRIL  Take 0.5 tablets (10 mg total) by mouth daily.     metFORMIN 850 MG tablet  Commonly known as:  GLUCOPHAGE  Take 850 mg by mouth 3 (three) times daily.       No Known Allergies    The results of significant diagnostics from this hospitalization (including imaging, microbiology, ancillary and laboratory) are listed below for reference.    Significant Diagnostic Studies: Dg Chest 2 View  07/19/2013   *RADIOLOGY REPORT*  Clinical Data: Chest pain and chest pressure.  CHEST - 2 VIEW  Comparison: No priors.  Findings: Lung volumes are low.  No acute consolidative air space disease.  No pleural effusions.  No evidence of pulmonary edema. Heart size is borderline enlarged. The patient is rotated to the right on today's exam, resulting in distortion of the mediastinal contours and reduced diagnostic sensitivity and specificity for mediastinal pathology.  IMPRESSION: 1.  Low lung volumes without radiographic evidence of acute cardiopulmonary disease.   Original Report Authenticated By: Trudie Reed, M.D.   Dg Ankle Complete Right  06/25/2013   *RADIOLOGY REPORT*  Clinical Data: Fall, right ankle pain/swelling  RIGHT ANKLE - COMPLETE 3+ VIEW  Comparison: None.  Findings: No fracture or dislocation is seen.  The ankle mortise is intact.  Well corticated osseous density inferior to the lateral malleolus, not acute.  The base of the fifth metatarsal is unremarkable.  Small plantar and posterior calcaneal enthesophytes.  Mild lateral soft tissue swelling.  IMPRESSION: No fracture or dislocation is seen.  Mild lateral soft tissue swelling.   Original Report Authenticated By: Charline Bills, M.D.   US Renal  07/19/2013   *RADIOLOGY REPORT*  Clinical Data: Renal failure  RENAL/URINARY TRACT ULTRASOUND COMPLETE  Comparison:  None.  Findings:  Right Kidney:  Normal reniform contour without hydronephrosis or nephrolithiasis.  The renal parenchyma is mildly echogenic.  The kidney measures  11.9 cm in length which is within normal limits.  Left Kidney:  Normal reniform contour without hydronephrosis or nephrolithiasis.  The renal parenchyma is mildly echogenic.  The kidney measures 12.2 cm in length which is within normal limits.  Bladder:  The bladder is normal in appearance.  IMPRESSION:  1.  No evidence of hydronephrosis. 2.  Mildly echogenic renal parenchyma bilaterally suggests underlying medical renal disease.   Original Report Authenticated By: Malachy Moan, M.D.    Microbiology: Recent Results (from the past 240 hour(s))  URINE CULTURE     Status: None   Collection Time    07/19/13 12:20 AM      Result Value Range Status   Specimen Description URINE, CLEAN CATCH   Final   Special Requests NONE   Final   Culture  Setup Time     Final   Value: 07/19/2013 09:19     Performed at [facility] Count     Final   Value: >=100,000 COLONIES/ML  Performed at [facility]     Final   Value: KLEBSIELLA PNEUMONIAE     Performed at [facility]   Report Status 07/20/2013 FINAL   Final   Organism ID, Bacteria KLEBSIELLA PNEUMONIAE   Final     Labs: Basic Metabolic Panel:  Recent Labs Lab 07/18/13 2343 07/19/13 0633 07/20/13 0826 07/21/13 0540  NA 134* 137 136 137  K 4.2 4.2 4.2 4.1  CL 98 104 102 103  CO2 25 24 26 27   GLUCOSE 309* 301* 241* 224*  BUN 38* 36* 23 28*  CREATININE 1.87* 1.68* 1.24 1.31  CALCIUM 8.8 8.8 9.1 9.1   Liver Function Tests:  Recent Labs Lab 07/18/13 2343  AST 14  ALT 14  ALKPHOS 73  BILITOT 0.3  PROT 6.7  ALBUMIN 2.7*   No results found for this basename: LIPASE, AMYLASE,  in the last 168 hours No results found for this basename: AMMONIA,  in the last 168 hours CBC:  Recent Labs Lab 07/18/13 2343 07/19/13 0633 07/20/13 0826 07/21/13 0540  WBC 20.6* 19.7* 12.3* 10.9*  NEUTROABS 14.8*  --   --   --   HGB 11.1* 10.8* 11.2* 10.9*  HCT 31.5* 31.5* 31.5* 31.6*  MCV 86.5 86.5  85.6 85.9  PLT 222 213 246 250   Cardiac Enzymes:  Recent Labs Lab 07/19/13 0600  TROPONINI <0.30   BNP: BNP (last 3 results) No results found for this basename: PROBNP,  in the last 8760 hours CBG:  Recent Labs Lab 07/19/13 2226 07/20/13 0740 07/20/13 1107 07/20/13 1650 07/20/13 2013  GLUCAP 252* 217* 263* 306* 267*       Signed:  November Sypher S  [facility] 07/21/2013, 7:35 AM

## 2013-07-21 NOTE — Progress Notes (Signed)
Nutrition Consult  RD consulted for nutrition education regarding diabetes. RD reviewed information with interpreter, [name]. Pt verbalized that he was drinking 6 x 16 oz regular sodas daily and 10 tortillas with each meal.   Lab Results  Component Value Date   HGBA1C 10.1* 07/19/2013    RD provided "Carbohydrate Counting for People with Diabetes" handout from the Academy of Nutrition and Dietetics. Discussed different food groups and their effects on blood sugar, emphasizing carbohydrate-containing foods. Provided list of carbohydrates and recommended serving sizes of common foods.  Discussed importance of controlled and consistent carbohydrate intake throughout the day. Provided examples of ways to balance meals/snacks and encouraged intake of high-fiber, whole grain complex carbohydrates. Teach back method used.  Expect good compliance.  Body mass index is 41.13 kg/(m^2). Pt meets criteria for Obese Class III based on current BMI.  Current diet order is Carbohydrate Modified Medium, patient is consuming approximately 100% of meals at this time. Labs and medications reviewed. No further nutrition interventions warranted at this time. RD contact information provided. If additional nutrition issues arise, please re-consult RD.  Phillip Motto MS, RD, LDN Pager: [phone] After-hours pager: [phone]

## 2013-10-06 ENCOUNTER — Encounter: Payer: Self-pay | Admitting: Cardiovascular Disease

## 2013-10-06 ENCOUNTER — Ambulatory Visit (INDEPENDENT_AMBULATORY_CARE_PROVIDER_SITE_OTHER): Payer: Medicaid Other | Admitting: Cardiovascular Disease

## 2013-10-06 VITALS — BP 172/100 | Ht 66.0 in | Wt 258.0 lb

## 2013-10-06 DIAGNOSIS — E1129 Type 2 diabetes mellitus with other diabetic kidney complication: Secondary | ICD-10-CM

## 2013-10-06 DIAGNOSIS — E1122 Type 2 diabetes mellitus with diabetic chronic kidney disease: Secondary | ICD-10-CM

## 2013-10-06 DIAGNOSIS — E119 Type 2 diabetes mellitus without complications: Secondary | ICD-10-CM

## 2013-10-06 DIAGNOSIS — I10 Essential (primary) hypertension: Secondary | ICD-10-CM

## 2013-10-06 DIAGNOSIS — N183 Chronic kidney disease, stage 3 unspecified: Secondary | ICD-10-CM

## 2013-10-06 DIAGNOSIS — I129 Hypertensive chronic kidney disease with stage 1 through stage 4 chronic kidney disease, or unspecified chronic kidney disease: Secondary | ICD-10-CM

## 2013-10-06 DIAGNOSIS — E1142 Type 2 diabetes mellitus with diabetic polyneuropathy: Secondary | ICD-10-CM

## 2013-10-06 DIAGNOSIS — E1165 Type 2 diabetes mellitus with hyperglycemia: Secondary | ICD-10-CM

## 2013-10-06 DIAGNOSIS — R079 Chest pain, unspecified: Secondary | ICD-10-CM

## 2013-10-06 DIAGNOSIS — E1149 Type 2 diabetes mellitus with other diabetic neurological complication: Secondary | ICD-10-CM

## 2013-10-06 MED ORDER — METOPROLOL SUCCINATE ER 25 MG PO TB24: 25.0000 mg | ORAL_TABLET | Freq: Every day | ORAL | Status: DC

## 2013-10-06 NOTE — Patient Instructions (Addendum)
Your physician has requested that you have a lexiscan myoview. For further information please visit [email or web address] Please follow instruction sheet, as given. Your physician recommends that you return for lab work fasting. Your physician has recommended you make the following change in your medication: start new prescription given for metoprolol succ 25 mg. This has already ben sent to the pharmacy.  Your physician recommends that you schedule a follow-up appointment in: 3 weeks.

## 2013-10-07 ENCOUNTER — Encounter: Payer: Self-pay | Admitting: Cardiovascular Disease

## 2013-10-07 DIAGNOSIS — E1122 Type 2 diabetes mellitus with diabetic chronic kidney disease: Secondary | ICD-10-CM | POA: Insufficient documentation

## 2013-10-07 DIAGNOSIS — E1142 Type 2 diabetes mellitus with diabetic polyneuropathy: Secondary | ICD-10-CM | POA: Insufficient documentation

## 2013-10-07 DIAGNOSIS — I129 Hypertensive chronic kidney disease with stage 1 through stage 4 chronic kidney disease, or unspecified chronic kidney disease: Secondary | ICD-10-CM | POA: Insufficient documentation

## 2013-10-07 DIAGNOSIS — N183 Chronic kidney disease, stage 3 unspecified: Secondary | ICD-10-CM | POA: Insufficient documentation

## 2013-10-07 DIAGNOSIS — E1165 Type 2 diabetes mellitus with hyperglycemia: Secondary | ICD-10-CM | POA: Insufficient documentation

## 2013-10-07 NOTE — Progress Notes (Signed)
Patient ID: Phillip Murphy, male   DOB: 08/08/1966, 47 y.o.   MRN: [id number]     PATIENT PROFILE: Phillip Murphy is a 10 -year-old [place] gentleman who is referred for cardiology evaluation through the courtesy of Dr. Lerry Liner for evaluation of labile hypertension as well as chest pain.   HPI: [name] admits to a several year history of hypertension. He has a history of hyperlipidemia as well as diabetes mellitus. He was hospitalized at [facility] in all after presenting with chest pain and accelerated hypertension. Of note, his diabetes was uncontrolled and his hemoglobin was 10.1. He was started on Lantus Glucophage and Glucotrol. He also had a urinary tract infection which grew Klebsiella sensitive to Levaquin for which she was treated. He had stage II hypertension and amlodipine 10 mg was added. Previously he had been on lisinopril at 20 mg and this was reduced to 10 mg due to worsening renal function with a creatinine that had risen to 1.87. It was felt that his chest pain most likely was musculoskeletal. A renal ultrasound did not show evidence for hydronephrosis. He had mildly echogenic renal parenchyma bilaterally suggesting underlying medical renal disease. His initial white count was 20.6 which improved to 10.9 with therapy. Cardiac enzymes were negative. A glucose measurement was 306 on one occasion. He underwent a 2-D echo Doppler study which showed an ejection fraction of 55-60% with normal wall motion. Subsequent to his hospitalization he saw Dr. Lerry Liner. He now is referred for cardiology evaluation was requested also to perform a stress test in light of his cardiac risk factors.  The chest pain that Phillip Murphy describes is not typically exertionally precipitated. At times it's constant. At other times he has noticed a tightness. He is bothered by peripheral neuropathy and apparently was started on gabapentin in the hospital with plus minus benefit. He does have a history  of obesity.  Of note, I did speak some in Spanish during this evaluation with the patient but also there was an interpreter as well as his daughter in the room.  Past Medical History  Diagnosis Date  . Diabetes mellitus without complication   . Hypertension     Past Surgical History  Procedure Laterality Date  . Appendectomy      No Known Allergies  Current Outpatient Prescriptions  Medication Sig Dispense Refill  . amLODipine (NORVASC) 10 MG tablet Take 1 tablet (10 mg total) by mouth daily.  30 tablet  2  . atorvastatin (LIPITOR) 20 MG tablet Take 20 mg by mouth daily.      Marland Kitchen glipiZIDE (GLUCOTROL) 10 MG tablet Take 10 mg by mouth daily.      . insulin glargine (LANTUS) 100 UNIT/ML injection Inject 28 Units into the skin at bedtime.      Marland Kitchen lisinopril-hydrochlorothiazide (PRINZIDE,ZESTORETIC) 20-25 MG per tablet Take 1 tablet by mouth daily.      . metFORMIN (GLUCOPHAGE) 850 MG tablet Take 850 mg by mouth 3 (three) times daily.      . metoprolol succinate (TOPROL XL) 25 MG 24 hr tablet Take 1 tablet (25 mg total) by mouth daily.  30 tablet  6   No current facility-administered medications for this visit.    Social history is notable that he was born in [place]. His mother was 34 or 55 years old when she had birth to him. He has been living in [place] for over 25 years. 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 He denies recent tobacco use. He denies excessive alcohol intake. He does take an occasional beer.  Family History  Problem Relation Age of Onset  . Hypertension Mother   . Diabetes Mother   . Diabetes Father   . Diabetes Sister   . Diabetes Brother   . Diabetes Maternal Aunt   . Diabetes Brother   . Diabetes Brother     ROS is negative for fever chills or night sweats. He denies any skin rashes. He denies any acute visual changes. He is unaware of any adenopathy. At times he does note some mild shortness of breath with activity. He does  note chest discomfort which seems at times to be more constant. At other times it is short lived. It is not classically precipitated by activity. He denies PND orthopnea. He denies cough or increased sputum production or wheezing. He does admit to being overweight for many years. He denies change in bowel or bladder habits. Urologically, he status post a recent Klebsiella UTI. He denies any known problems with his prostate. At present, he just denies dysuria or urinary frequency. He denies any rectal bleeding. He does admit to peripheral neuropathy symptoms that are bothersome. He denies claudication. He denies myalgias. He's unaware of thyroid abnormalities.  Diabetes has been poorly controlled. He denies psychological issues. From a sleep perspective, he does snore. He denies residual daytime sleepiness. He denies restless legs. Other comprehensive system 14 review is negative.  PE BP 172/100  Ht 5\' 6"  (1.676 m)  Wt 258 lb (117.028 kg)  BMI 41.66 kg/m2 172/98 when rechecked by me General: Alert, oriented, no distress.  Skin: normal turgor, no rashes HEENT: Normocephalic, atraumatic. Pupils round and reactive; sclera anicteric; Fundi arterial narrowing but no hemorrhages or exudates. Nose without nasal septal hypertrophy Mouth/Parynx benign; Mallinpatti scale 3 Neck: No JVD, no carotid briuts No adenopathy Lungs: clear to ausculatation and percussion; no wheezing or rales Chest wall with tenderness over the left costochondral region and laterally in the anterior axillary line. Heart: RRR, s1 s2 normal no S3 gallop or S4. 1/6 systolic murmur. Abdomen: soft, nontender; no hepatosplenomehaly, BS+; abdominal aorta nontender and not dilated by palpation. Pulses 2+ Extremities: no clubbinbg cyanosis or edema, Homan's sign negative  Neurologic:  cranial nerves appear grossly intact. Peripheral neuropathy Psychological: Normal affect and mood appear   ECG: Sinus bradycardia at 58 beats per minute.  PR interval 150 ms. QTc interval 44 ms. No significant ST abnormalities.  LABS:  BMET    Component Value Date/Time   NA 137 07/21/2013 0540   K 4.1 07/21/2013 0540   CL 103 07/21/2013 0540   CO2 27 07/21/2013 0540   GLUCOSE 224* 07/21/2013 0540   BUN 28* 07/21/2013 0540   CREATININE 1.31 07/21/2013 0540   CALCIUM 9.1 07/21/2013 0540   GFRNONAA 63* 07/21/2013 0540   GFRAA 73* 07/21/2013 0540     Hepatic Function Panel     Component Value Date/Time   PROT 6.7 07/18/2013 2343   ALBUMIN 2.7* 07/18/2013 2343   AST 14 07/18/2013 2343   ALT 14 07/18/2013 2343   ALKPHOS 73 07/18/2013 2343   BILITOT 0.3 07/18/2013 2343     CBC    Component Value Date/Time   WBC 10.9* 07/21/2013 0540   RBC 3.68* 07/21/2013 0540   HGB 10.9* 07/21/2013 0540   HCT 31.6* 07/21/2013 0540   PLT 250 07/21/2013 0540   MCV 85.9 07/21/2013 0540   MCH 29.6 07/21/2013 0540  MCHC 34.5 07/21/2013 0540   RDW 12.1 07/21/2013 0540   LYMPHSABS 3.7 07/18/2013 2343   MONOABS 1.9* 07/18/2013 2343   EOSABS 0.2 07/18/2013 2343   BASOSABS 0.0 07/18/2013 2343     BNP No results found for this basename: probnp    Lipid Panel  No results found for this basename: chol, trig, hdl, cholhdl, vldl, ldlcalc     RADIOLOGY: No results found.   ASSESSMENT AND PLAN: My impression is that Phillip Murphy presents with stage II hypertension. His blood pressure today remains elevated. When he was hospitalized in August, his creatinine had risen to 1.87. I suspect this was a combination of his diabetes mellitus, hypertension, and urinary tract infection. He was treated for Klebsiella UTI. His dose of ACE inhibitor was reduced in light of his renal function. I do not have any further blood work evaluation on him since. [place], and hesitant to further titrate his ACE inhibition or start aldosterone blockade until I know his renal function. For this reason I am electing to start low-dose metoprolol succinate initially at 25 mg daily beta blockade to his  regimen. I suspect his chest pain most likely is musculoskeletal. However, he does have significant cardiac risk factors. I will schedule him for a adenosine cardiac nuclear scan for further evaluation. He does note some shortness of breath and diabetics often may develop just dyspnea as there presentation for coronary obstructive disease. He will require aggressive lipid intervention with a target LDL less than 70 in this diabetic male. He does have peripheral neuropathy most likely complicating his diabetes. I did review his echo Doppler study which had been done in August 2014 while he was hospitalized at [facility]. I will see him back in the office in several weeks a followup of his blood work and Myoview scan. I suspect further titration of his medications will be necessary for optimal blood pressure control.   Lennette Bihari, MD, [facility] 10/07/2013 11:48 AM

## 2013-10-10 ENCOUNTER — Encounter: Payer: Self-pay | Admitting: Cardiovascular Disease

## 2013-10-12 ENCOUNTER — Telehealth (HOSPITAL_COMMUNITY): Payer: Self-pay | Admitting: *Deleted

## 2013-10-18 NOTE — Telephone Encounter (Signed)
Unable to leave message about his nuc.

## 2013-10-19 ENCOUNTER — Telehealth (HOSPITAL_COMMUNITY): Payer: Self-pay | Admitting: *Deleted

## 2013-10-31 ENCOUNTER — Telehealth (HOSPITAL_COMMUNITY): Payer: Self-pay | Admitting: *Deleted

## 2013-10-31 NOTE — Telephone Encounter (Signed)
  [facility] AT [facility] [address] [place], [place] [address] [phone]    Our office has attempted to contact your patient.  twice by telephone and we have also sent an appointment letter to schedule the Stress  test you ordered. The patient has not responded. We will not make any further attempts to contact the patient. If any further assistance is needed for this referral, please contact our office at [phone]  Sincerely, [facility] Scheduling Team

## 2013-11-04 ENCOUNTER — Emergency Department (HOSPITAL_COMMUNITY): Payer: Medicaid Other

## 2013-11-04 ENCOUNTER — Emergency Department (HOSPITAL_COMMUNITY)
Admission: EM | Admit: 2013-11-04 | Discharge: 2013-11-04 | Disposition: A | Discharge status: Home / Self Care | Payer: Medicaid Other | Attending: Emergency Medicine | Admitting: Emergency Medicine

## 2013-11-04 ENCOUNTER — Encounter (HOSPITAL_COMMUNITY): Payer: Self-pay | Admitting: Emergency Medicine

## 2013-11-04 DIAGNOSIS — M549 Dorsalgia, unspecified: Secondary | ICD-10-CM

## 2013-11-04 DIAGNOSIS — M546 Pain in thoracic spine: Secondary | ICD-10-CM | POA: Insufficient documentation

## 2013-11-04 DIAGNOSIS — R209 Unspecified disturbances of skin sensation: Secondary | ICD-10-CM | POA: Insufficient documentation

## 2013-11-04 DIAGNOSIS — R9431 Abnormal electrocardiogram [ECG] [EKG]: Secondary | ICD-10-CM | POA: Insufficient documentation

## 2013-11-04 DIAGNOSIS — I10 Essential (primary) hypertension: Secondary | ICD-10-CM

## 2013-11-04 DIAGNOSIS — Z79899 Other long term (current) drug therapy: Secondary | ICD-10-CM | POA: Insufficient documentation

## 2013-11-04 DIAGNOSIS — E119 Type 2 diabetes mellitus without complications: Secondary | ICD-10-CM | POA: Insufficient documentation

## 2013-11-04 DIAGNOSIS — R51 Headache: Secondary | ICD-10-CM | POA: Insufficient documentation

## 2013-11-04 DIAGNOSIS — Z794 Long term (current) use of insulin: Secondary | ICD-10-CM | POA: Insufficient documentation

## 2013-11-04 LAB — CBC WITH DIFFERENTIAL/PLATELET
Basophils Absolute: 0.1 10*3/uL (ref 0.0–0.1)
Basophils Relative: 1 % (ref 0–1)
Eosinophils Relative: 2 % (ref 0–5)
HCT: 40.1 % (ref 39.0–52.0)
Hemoglobin: 14.3 g/dL (ref 13.0–17.0)
Lymphocytes Relative: 22 % (ref 12–46)
MCHC: 35.7 g/dL (ref 30.0–36.0)
MCV: 84.4 fL (ref 78.0–100.0)
Monocytes Absolute: 0.7 10*3/uL (ref 0.1–1.0)
Monocytes Relative: 5 % (ref 3–12)
Neutro Abs: 9.6 10*3/uL — ABNORMAL HIGH (ref 1.7–7.7)
RDW: 12.3 % (ref 11.5–15.5)

## 2013-11-04 LAB — BASIC METABOLIC PANEL
BUN: 30 mg/dL — ABNORMAL HIGH (ref 6–23)
CO2: 26 mEq/L (ref 19–32)
Calcium: 9.4 mg/dL (ref 8.4–10.5)
Chloride: 100 mEq/L (ref 96–112)
Creatinine, Ser: 1.47 mg/dL — ABNORMAL HIGH (ref 0.50–1.35)

## 2013-11-04 LAB — URINALYSIS, ROUTINE W REFLEX MICROSCOPIC
Bilirubin Urine: NEGATIVE
Glucose, UA: >1000 mg/dL — AB
Ketones, ur: NEGATIVE mg/dL
Protein, ur: >300 mg/dL — AB
pH: 5 (ref 5.0–8.0)

## 2013-11-04 LAB — URINE MICROSCOPIC-ADD ON

## 2013-11-04 MED ORDER — AMLODIPINE BESYLATE 10 MG PO TABS: 10.0000 mg | ORAL_TABLET | Freq: Every day | ORAL | Status: DC

## 2013-11-04 MED ORDER — METOPROLOL SUCCINATE ER 25 MG PO TB24: 25.0000 mg | ORAL_TABLET | Freq: Every day | ORAL | Status: DC

## 2013-11-04 MED ORDER — CLONIDINE HCL 0.1 MG PO TABS
0.1000 mg | ORAL_TABLET | Freq: Once | ORAL | Status: AC
  Administered 2013-11-04: 0.1 mg via ORAL
  Filled 2013-11-04: qty 1

## 2013-11-04 MED ORDER — OXYCODONE-ACETAMINOPHEN 5-325 MG PO TABS
2.0000 | ORAL_TABLET | Freq: Once | ORAL | Status: AC
  Administered 2013-11-04: 2 via ORAL
  Filled 2013-11-04: qty 2

## 2013-11-04 MED ORDER — TRAMADOL HCL 50 MG PO TABS: 50.0000 mg | ORAL_TABLET | Freq: Four times a day (QID) | ORAL | Status: DC | PRN

## 2013-11-04 MED ORDER — ONDANSETRON 4 MG PO TBDP
4.0000 mg | ORAL_TABLET | Freq: Once | ORAL | Status: AC
  Administered 2013-11-04: 4 mg via ORAL
  Filled 2013-11-04: qty 1

## 2013-11-04 NOTE — ED Notes (Signed)
Pt presents with back pain upon waking on Thursday morning.

## 2013-11-04 NOTE — ED Notes (Signed)
PA made aware of pt's b/p. Additional orders received

## 2013-11-04 NOTE — ED Notes (Signed)
Pt knows that urine is needed 

## 2013-11-04 NOTE — ED Provider Notes (Signed)
CSN: [id number]     Arrival date & time 11/04/13  1018 History  This chart was scribed for non-physician practitioner, Marlon Pel, PA-C,working with Raeford Razor, MD, by Karle Plumber, ED Scribe.  This patient was seen in room TR07C/TR07C and the patient's care was started at 10:40 AM.  Chief Complaint  Patient presents with  . Back Pain   Patient is a 47 y.o. male presenting with back pain. The history is provided by the patient. No language interpreter was used.  Back Pain Location:  Thoracic spine (right flank pain) Radiates to:  Does not radiate Pain severity:  Moderate Pain is:  Same all the time Onset quality:  Sudden Duration:  3 days Timing:  Constant Progression:  Unchanged Chronicity:  New Context: not recent injury   Relieved by:  Nothing Worsened by:  Nothing tried Ineffective treatments:  Ibuprofen Associated symptoms: headaches   Associated symptoms: no abdominal pain, no abdominal swelling, no chest pain, no dysuria and no fever   Risk factors: obesity    HPI Comments:  Phillip Murphy is a 47 y.o. male with h/o HTN who presents to the Emergency Department complaining of sudden onset, moderate back pain upon waking three days ago. He reports intermittent associated headache. He states he has not taken his blood pressure medication in eight days. He states when he takes his BP meds it makes his feet tingle. He reports taking Ibuprofen with no relief. He denies any visual changes. He denies hematuria and dysuria.   Patient is supposed to take Norvasc, Toprol,  Lisinopril-hydrochlorothiazide. He is not taking any of these. Believe he was only supposed to be taking his Lis-HCTZ but it made his feet feel numb therefore he discontinued the medication 8 days ago, his right flank pain started 4-5 days ago. He says that he did not know that not taking it would be a bad thing. He is taking his diabetes medication as prescribed. I reviewed previous provider notes and he is  supposed to have Stress test by [facility] but they note that they have been unable to contact him. Pt is poor historian and seems unsure of what medications he is taking and why.   Past Medical History  Diagnosis Date  . Diabetes mellitus without complication   . Hypertension    Past Surgical History  Procedure Laterality Date  . Appendectomy     Family History  Problem Relation Age of Onset  . Hypertension Mother   . Diabetes Mother   . Diabetes Father   . Diabetes Sister   . Diabetes Brother   . Diabetes Maternal Aunt   . Diabetes Brother   . Diabetes Brother    History  Substance Use Topics  . Smoking status: Never Smoker   . Smokeless tobacco: Never Used  . Alcohol Use: Yes    Review of Systems  Constitutional: Negative for fever.  Eyes: Negative for visual disturbance.  Cardiovascular: Negative for chest pain.  Gastrointestinal: Negative for abdominal pain.  Genitourinary: Negative for dysuria and hematuria.  Musculoskeletal: Positive for back pain.  Neurological: Positive for headaches.    Allergies  Review of patient's allergies indicates no known allergies.  Home Medications   Current Outpatient Rx  Name  Route  Sig  Dispense  Refill  . amLODipine (NORVASC) 10 MG tablet   Oral   Take 1 tablet (10 mg total) by mouth daily.   30 tablet   2   . atorvastatin (LIPITOR) 20 MG tablet   Oral  Take 20 mg by mouth daily.         Marland Kitchen glipiZIDE (GLUCOTROL) 10 MG tablet   Oral   Take 10 mg by mouth daily.         . insulin glargine (LANTUS) 100 UNIT/ML injection   Subcutaneous   Inject 28 Units into the skin at bedtime.         Marland Kitchen lisinopril-hydrochlorothiazide (PRINZIDE,ZESTORETIC) 20-25 MG per tablet   Oral   Take 1 tablet by mouth daily.         . metFORMIN (GLUCOPHAGE) 850 MG tablet   Oral   Take 850 mg by mouth 3 (three) times daily.         . metoprolol succinate (TOPROL XL) 25 MG 24 hr tablet   Oral   Take 1 tablet (25 mg total) by mouth  daily.   30 tablet   6    Triage Vitals: BP 211/92  Temp(Src) 96.9 F (36.1 C) (Oral)  Resp 16  Ht 5\' 6"  (1.676 m)  Wt 250 lb (113.399 kg)  BMI 40.37 kg/m2  SpO2 100% Physical Exam  Nursing note and vitals reviewed. Constitutional: He is oriented to person, place, and time. He appears well-developed and well-nourished. No distress.  HENT:  Head: Normocephalic and atraumatic.  Eyes: Conjunctivae and EOM are normal. Pupils are equal, round, and reactive to light.  Fundoscopic exam:      The right eye shows no papilledema. The right eye shows red reflex.       The left eye shows no papilledema. The left eye shows red reflex.  Neck: Normal range of motion. Neck supple.  Cardiovascular: Normal rate and regular rhythm.   Pulmonary/Chest: Effort normal.  Abdominal: Soft.  Neurological: He is alert and oriented to person, place, and time. No cranial nerve deficit.  Skin: Skin is warm and dry.  Psychiatric: He has a normal mood and affect. His behavior is normal.    ED Course  Procedures (including critical care time) DIAGNOSTIC STUDIES: Oxygen Saturation is 100% on RA, normal by my interpretation.   COORDINATION OF CARE: 10:48 AM- Will obtain lab work and administer Clonidine to lower blood pressure. Will administer Percocet for back pain and Zofran for associated nausea. Pt verbalizes understanding and agrees to plan.  1:03 PM- Informed pt that his BP has lowered. Advised pt that his lisinopril/hctz is not causing his foot numbness/tingling per his cardiologist, Dr. Tresa Endo. Will give pt prescriptions for Norvasc and Toprol XL as he is supposed to be taking these already and never started taking them. Will also wrist prescription for Ultram for pain. Pt refuses to take the Lisinopril/HCTZ.  Advised pt to take all medications as prescribed and to report back to the ED immediately if he experiences any side effects and/ior desires to discontinue taking the meds. Dr. Landry Dyke office will  try to contact pt again on Monday to set up appt for nuclear stress test. Confirmed telephone number with pt since Dr. Landry Dyke office has been having a hard time contacting pt. Pt did not know his own telephone number and we had to call another phone to get the number. Advised pt to follow up and call Dr. Landry Dyke office ASAP if he does not hear from them on Monday, instructions and conversation was done in [place].   Medications  cloNIDine (CATAPRES) tablet 0.1 mg (0.1 mg Oral Given 11/04/13 1047)  oxyCODONE-acetaminophen (PERCOCET/ROXICET) 5-325 MG per tablet 2 tablet (2 tablets Oral Given 11/04/13 1048)  ondansetron (ZOFRAN-ODT)  disintegrating tablet 4 mg (4 mg Oral Given 11/04/13 1048)  cloNIDine (CATAPRES) tablet 0.1 mg (0.1 mg Oral Given 11/04/13 1124)    Labs Review Labs Reviewed  CBC WITH DIFFERENTIAL - Abnormal; Notable for the following:    WBC 13.5 (*)    Neutro Abs 9.6 (*)    All other components within normal limits  BASIC METABOLIC PANEL - Abnormal; Notable for the following:    Glucose, Bld 322 (*)    BUN 30 (*)    Creatinine, Ser 1.47 (*)    GFR calc non Af Amer 55 (*)    GFR calc Af Amer 64 (*)    All other components within normal limits  TROPONIN I  URINALYSIS, ROUTINE W REFLEX MICROSCOPIC   Imaging Review Dg Chest 2 View  11/04/2013   CLINICAL DATA:  Right flank pain and thoracic pain.  EXAM: CHEST  2 VIEW  COMPARISON:  07/19/2013  FINDINGS: Two views of the chest demonstrate low lung volumes which is similar to the prior examination. There is some vascular crowding in the right hilum which is stable. Stable appearance of the heart. The trachea is midline. Bony thorax is intact.  IMPRESSION: Low lung volumes without focal disease.  Stable examination.   Electronically Signed   By: Richarda Overlie M.D.   On: 11/04/2013 12:41    EKG Interpretation   None       MDM  No diagnosis found.  DARLENE, BARTELT ON:[id number] 04-Nov-2013 11:59:06  [facility]/ED  ROUTINE RECORD  Sinus rhythm Repol abnrm suggests ischemia, lateral leads (flipped t-waves in lateral leaves) 57mm/s 79mm/mV 100Hz  8.0.1 CID: [id number] Referred by: Unconfirmed Vent. rate 61 BPM PR interval 161 ms QRS duration 95 ms QT/QTc 424/427 ms P-R-T axes -5 9 113   pt here for right flank pain.  I reviewed cardiologist notes when patient had not yet been taking medications and at this visit his BP was 170/ 70. Today the patients bp is between 224-211/ 100.  That with the patient having flank pain that is not consistent with musculoskeletal pain  I want to ensure no end organ damage to kidneys.  Pt has been given dose of Clonidine. Made aware of concerns and my plans. Moved from fast track to Pod A.  12:55 pm - patients blood pressure now 167/85 after 0.2 Clonidine. Labs are reassuring.  1:24pm- I spoke with Dr. Burman Freestone ([facility]) pt is supposed to be on 3 BP medications. Recommends that he takes all three of these. Asks me to confirm pt phone number since they have been unable to reach him. Discussed EKG changes. In light that pt is having no cardiac symptoms, he does not recommend admission at this time, pt back pain improved as well. He does feel that patient needs nuclear stress test which they have been trying to get patient set up with. Pt will call office on Monday if he does not hear from Dr. Ellin Goodie office. Discussed case with Dr. Juleen China.  47 y.o.Phillip Murphy's evaluation in the Emergency Department is complete. It has been determined that no acute conditions requiring further emergency intervention are present at this time. The patient/guardian have been advised of the diagnosis and plan. We have discussed signs and symptoms that warrant return to the ED, such as changes or worsening in symptoms.  Vital signs are stable at discharge. Filed Vitals:   11/04/13 1300  BP: 179/85  Temp:   Resp: 18    Patient/guardian has voiced understanding and agreed  to follow-up with the PCP or  specialist.  I personally performed the services described in this documentation, which was scribed in my presence. The recorded information has been reviewed and is accurate.   Dorthula Matas, PA-C 11/04/13 1326

## 2013-11-05 NOTE — ED Provider Notes (Signed)
Medical screening examination/treatment/procedure(s) were performed by non-physician practitioner and as supervising physician I was immediately available for consultation/collaboration.  EKG Interpretation   None        Raeford Razor, MD 11/05/13 [phone]

## 2013-11-08 ENCOUNTER — Ambulatory Visit (INDEPENDENT_AMBULATORY_CARE_PROVIDER_SITE_OTHER): Payer: Medicaid Other | Admitting: Cardiology

## 2013-11-08 ENCOUNTER — Encounter: Payer: Self-pay | Admitting: Cardiology

## 2013-11-08 VITALS — BP 160/100 | HR 80 | Ht 66.0 in | Wt 253.0 lb

## 2013-11-08 DIAGNOSIS — I10 Essential (primary) hypertension: Secondary | ICD-10-CM

## 2013-11-08 DIAGNOSIS — R079 Chest pain, unspecified: Secondary | ICD-10-CM

## 2013-11-08 DIAGNOSIS — J069 Acute upper respiratory infection, unspecified: Secondary | ICD-10-CM

## 2013-11-08 DIAGNOSIS — N183 Chronic kidney disease, stage 3 unspecified: Secondary | ICD-10-CM

## 2013-11-08 DIAGNOSIS — M549 Dorsalgia, unspecified: Secondary | ICD-10-CM | POA: Insufficient documentation

## 2013-11-08 DIAGNOSIS — E1122 Type 2 diabetes mellitus with diabetic chronic kidney disease: Secondary | ICD-10-CM

## 2013-11-08 DIAGNOSIS — E1129 Type 2 diabetes mellitus with other diabetic kidney complication: Secondary | ICD-10-CM

## 2013-11-08 MED ORDER — IRBESARTAN 150 MG PO TABS: 150.0000 mg | ORAL_TABLET | Freq: Every day | ORAL | Status: DC

## 2013-11-08 MED ORDER — METOPROLOL SUCCINATE ER 50 MG PO TB24: 50.0000 mg | ORAL_TABLET | Freq: Every day | ORAL | Status: DC

## 2013-11-08 MED ORDER — HYDROCHLOROTHIAZIDE 12.5 MG PO CAPS: 12.5000 mg | ORAL_CAPSULE | Freq: Every day | ORAL | Status: DC

## 2013-11-08 NOTE — Patient Instructions (Signed)
From the hospital records your sugar/glucose was very high.    Your blood pressure was also high and your kidneys are showing stress so it very very important to take your medication.  I changed your lisinopril to another BP medication, 2 meds.  If your have side effects then please call and we can re adjust.     You also need stress test but we need better BP control first,  So see me back next week and we will check BP and then schedule test.  For cold symptoms musinex (over the counter) is fine to use and Musinex DM is ok.  Claritin, allegra or zytec are also ok for cold symptoms.  No pseudoephedrine (pseudophed),   So no meds with a "D" this stands for pseudophed.  Watch your diet with salt and sugar, better to use wheat bread and brown rice -not so much white bread or white rice.

## 2013-11-08 NOTE — Assessment & Plan Note (Signed)
Patient had complained of some chest discomfort back pain as well we will schedule him for a stress test once his blood pressure is controlled.

## 2013-11-08 NOTE — Assessment & Plan Note (Addendum)
Poorly controlled, I'm not sure he's been taking his blood pressure medication he is taking Ultram for pain but he thought that was a blood pressure medication. He did not like lisinopril HCTZ as it made his legs feel numb.  I adjusted his medications stopped we'll set up we'll HCTZ added Avapro 150 mg daily added HCTZ 12.5 mg daily and increase his metoprolol to 50 mg daily.  These all went to the drugstore he requested to go to. His daughter was here today and was interpreting.  Discussed the importance of blood pressure control to prevent stroke heart attack and kidney failure that could result in dialysis.  They tell me they do watch the salt in the diet.

## 2013-11-08 NOTE — Assessment & Plan Note (Signed)
Poorly controlled diabetes mellitus. He was spilling glucose in his urinalysis from the emergency room and his glucose on that visit was 320. Again discussed the importance of taking medications to prevent dialysis in the future, as well as heart attacks and stroke.

## 2013-11-08 NOTE — Progress Notes (Signed)
11/08/2013   PCP: Provider Not In System   Chief Complaint  Patient presents with  . Follow-up    S/p ER visit ;also has been having pain on left side of his cabasa and into his neck pain started two days ago no vision problems    Primary Cardiologist: Dr. Tresa Endo  HPI:  [name] admits to a several year history of hypertension. He has a history of hyperlipidemia as well as diabetes mellitus. He was hospitalized at [facility] after presenting with chest pain and accelerated hypertension. Of note, his diabetes was uncontrolled and his hemoglobin was 10.1. He was started on Lantus Glucophage and Glucotrol. He also had a urinary tract infection which grew Klebsiella sensitive to Levaquin for which she was treated. He had stage II hypertension and amlodipine 10 mg was added. Previously he had been on lisinopril at 20 mg and this was reduced to 10 mg due to worsening renal function with a creatinine that had risen to 1.87. It was felt that his chest pain most likely was musculoskeletal. A renal ultrasound did not show evidence for hydronephrosis. He had mildly echogenic renal parenchyma bilaterally suggesting underlying medical renal disease. His initial white count was 20.6 which improved to 10.9 with therapy. Cardiac enzymes were negative. A glucose measurement was 306 on one occasion. He underwent a 2-D echo Doppler study which showed an ejection fraction of 55-60% with normal wall motion. Subsequent to his hospitalization he saw Dr. Lerry Liner. He was referred for cardiology evaluation with Dr. Tresa Endo and it was requested to perform a stress test in light of his cardiac risk factors.   Since his visit with Dr. Tresa Endo has been difficult to reach the patient to arrange for the stress test.  In the meantime he has presented to the emergency room with headache and continued uncontrolled hypertension.  His glucose was also 320 and he was spilling glucose in his urine as well.   He tells me he does not like the lisinopril HCTZ if it makes his legs feel numb.    We called his pharmacy and he has not had prescriptions filled in some time though he stated he picked his prescriptions up on Friday and has been taking recently has been taking Ultram.  No chest pain no shortness of breath he complains of left scapular pain and some headache.  He also has sinus type congestion with runny nose and sore throat.     No Known Allergies  Current Outpatient Prescriptions  Medication Sig Dispense Refill  . amLODipine (NORVASC) 10 MG tablet Take 1 tablet (10 mg total) by mouth daily.  30 tablet  0  . atorvastatin (LIPITOR) 20 MG tablet Take 20 mg by mouth daily.      Marland Kitchen glipiZIDE (GLUCOTROL) 10 MG tablet Take 10 mg by mouth daily.      . insulin glargine (LANTUS) 100 UNIT/ML injection Inject 28 Units into the skin at bedtime.      . metFORMIN (GLUCOPHAGE) 850 MG tablet Take 850 mg by mouth 3 (three) times daily.      . metoprolol succinate (TOPROL XL) 50 MG 24 hr tablet Take 1 tablet (50 mg total) by mouth daily.  30 tablet  6  . hydrochlorothiazide (MICROZIDE) 12.5 MG capsule Take 1 capsule (12.5 mg total) by mouth daily.  30 capsule  3  . irbesartan (AVAPRO) 150 MG tablet Take 1 tablet (150 mg total) by mouth daily.  30 tablet  6   No current facility-administered medications for this visit.    Past Medical History  Diagnosis Date  . Diabetes mellitus without complication   . Hypertension     Past Surgical History  Procedure Laterality Date  . Appendectomy      ZOX:WRUEAVW:+ colds symptoms runny nose, clear and scratchy throat, no fevers, no weight changes Skin:no rashes or ulcers HEENT:no blurred vision, no congestion CV:see HPI PUL:see HPI GI:no diarrhea constipation or melena, no indigestion GU:no hematuria, no dysuria MS:no joint pain, no claudication Neuro:no syncope, no lightheadedness Endo:+ diabetes, no thyroid disease, they try to watch  diet.  PHYSICAL EXAM BP 160/100  Pulse 80  Ht 5\' 6"  (1.676 m)  Wt 253 lb (114.76 kg)  BMI 40.85 kg/m2 General:Pleasant affect, NAD Skin:Warm and dry, brisk capillary refill HEENT:normocephalic, sclera clear, mucus membranes moist Neck:supple, no JVD, no bruits  Heart:S1S2 RRR without murmur, gallup, rub or click Lungs:clear without rales, rhonchi, or wheezes UJW:JXBJ, non tender, + BS, do not palpate liver spleen or masses Ext:no lower ext edema, 2+ pedal pulses, 2+ radial pulses Neuro:alert and oriented, MAE, follows commands, + facial symmetry YNW:GNFAOZHY from 11/04/13 no longer with T wave inversions in lat leads.  SR nonspecific T wave abnormality.  ASSESSMENT AND PLAN Hypertension Poorly controlled, I'm not sure he's been taking his blood pressure medication he is taking Ultram for pain but he thought that was a blood pressure medication. He did not like lisinopril HCTZ as it made his legs feel numb.  I adjusted his medications stopped we'll set up we'll HCTZ added Avapro 150 mg daily added HCTZ 12.5 mg daily and increase his metoprolol to 50 mg daily.  These all went to the drugstore he requested to go to. His daughter was here today and was interpreting.  Discussed the importance of blood pressure control to prevent stroke heart attack and kidney failure that could result in dialysis.  They tell me they do watch the salt in the diet.  Diabetes mellitus with stage 3 chronic kidney disease Poorly controlled diabetes mellitus. He was spilling glucose in his urinalysis from the emergency room and his glucose on that visit was 320. Again discussed the importance of taking medications to prevent dialysis in the future, as well as heart attacks and stroke.  Back pain Below the scapula he has had pain and continues with discomfort.  I can recreate it with palpation and actually feels better when it is palpated.  We discussed taking the Ultram and/or Tylenol as needed.  URI (upper  respiratory infection) Has the beginnings of upper respiratory infection with nasal congestion hoarseness.  I gave information on what to take for cold symptoms Mucinex, Mucinex DM, Claritin, Allegra, or Zyrtec. But not to use medications with pseudoephedrine as this would increase his blood pressure.    Chest pain Patient had complained of some chest discomfort back pain as well we will schedule him for a stress test once his blood pressure is controlled.    I will see him back next week and order stress myoview.

## 2013-11-08 NOTE — Assessment & Plan Note (Signed)
Has the beginnings of upper respiratory infection with nasal congestion hoarseness.  I gave information on what to take for cold symptoms Mucinex, Mucinex DM, Claritin, Allegra, or Zyrtec. But not to use medications with pseudoephedrine as this would increase his blood pressure.

## 2013-11-08 NOTE — Assessment & Plan Note (Signed)
Below the scapula he has had pain and continues with discomfort.  I can recreate it with palpation and actually feels better when it is palpated.  We discussed taking the Ultram and/or Tylenol as needed.

## 2013-11-10 ENCOUNTER — Ambulatory Visit: Payer: Medicaid Other | Admitting: Cardiovascular Disease

## 2013-11-13 ENCOUNTER — Emergency Department (HOSPITAL_COMMUNITY)
Admission: EM | Admit: 2013-11-13 | Discharge: 2013-11-13 | Disposition: A | Discharge status: Home / Self Care | Payer: Medicaid Other | Attending: Emergency Medicine | Admitting: Emergency Medicine

## 2013-11-13 ENCOUNTER — Encounter (HOSPITAL_COMMUNITY): Payer: Self-pay | Admitting: Emergency Medicine

## 2013-11-13 DIAGNOSIS — I10 Essential (primary) hypertension: Secondary | ICD-10-CM

## 2013-11-13 DIAGNOSIS — Z791 Long term (current) use of non-steroidal anti-inflammatories (NSAID): Secondary | ICD-10-CM | POA: Insufficient documentation

## 2013-11-13 DIAGNOSIS — M549 Dorsalgia, unspecified: Secondary | ICD-10-CM

## 2013-11-13 DIAGNOSIS — M545 Low back pain, unspecified: Secondary | ICD-10-CM | POA: Insufficient documentation

## 2013-11-13 DIAGNOSIS — Z794 Long term (current) use of insulin: Secondary | ICD-10-CM | POA: Insufficient documentation

## 2013-11-13 DIAGNOSIS — E119 Type 2 diabetes mellitus without complications: Secondary | ICD-10-CM | POA: Insufficient documentation

## 2013-11-13 DIAGNOSIS — R51 Headache: Secondary | ICD-10-CM | POA: Insufficient documentation

## 2013-11-13 DIAGNOSIS — Z79899 Other long term (current) drug therapy: Secondary | ICD-10-CM | POA: Insufficient documentation

## 2013-11-13 DIAGNOSIS — J069 Acute upper respiratory infection, unspecified: Secondary | ICD-10-CM | POA: Insufficient documentation

## 2013-11-13 LAB — RAPID STREP SCREEN (MED CTR MEBANE ONLY): Streptococcus, Group A Screen (Direct): NEGATIVE

## 2013-11-13 MED ORDER — GUAIFENESIN ER 600 MG PO TB12
1200.0000 mg | ORAL_TABLET | Freq: Once | ORAL | Status: AC
  Administered 2013-11-13: 1200 mg via ORAL
  Filled 2013-11-13: qty 2

## 2013-11-13 MED ORDER — GUAIFENESIN ER 600 MG PO TB12: 1200.0000 mg | ORAL_TABLET | Freq: Two times a day (BID) | ORAL | Status: DC | PRN

## 2013-11-13 MED ORDER — OXYCODONE-ACETAMINOPHEN 5-325 MG PO TABS: 2.0000 | ORAL_TABLET | ORAL | Status: DC | PRN

## 2013-11-13 NOTE — ED Notes (Signed)
Patient states sore throat with nasal congestion x 3 days, patient states also intermittent headaches and right flank pain, denies urinary symptoms

## 2013-11-13 NOTE — ED Provider Notes (Signed)
CSN: [id number]     Arrival date & time 11/13/13  0425 History   First MD Initiated Contact with Patient 11/13/13 [phone]     Chief Complaint  Patient presents with  . Sore Throat  . Nasal Congestion  . Flank Pain   (Consider location/radiation/quality/duration/timing/severity/associated sxs/prior Treatment) HPI 47 year old male presents to emergency room with complaint of 3 days of sore throat with nasal congestion and cough, occasional headaches, and bilateral lower back pain.  Patient denies any fever or chills.  No sick contacts.  The cough does not make his back pain, worse.  He has had back pain ongoing for some time.  Prior notes reviewed, patient seen last week for same complaint, instructed to use over-the-counter cold remedies, not containing pseudoephedrine.  He reports he has not been doing this.  Patient has history of poorly controlled diabetes and hypertension.  He reports he has been taking his blood pressure medicines as prescribed.  Past Medical History  Diagnosis Date  . Diabetes mellitus without complication   . Hypertension    Past Surgical History  Procedure Laterality Date  . Appendectomy     Family History  Problem Relation Age of Onset  . Hypertension Mother   . Diabetes Mother   . Diabetes Father   . Diabetes Sister   . Diabetes Brother   . Diabetes Maternal Aunt   . Diabetes Brother   . Diabetes Brother    History  Substance Use Topics  . Smoking status: Never Smoker   . Smokeless tobacco: Never Used  . Alcohol Use: Yes    Review of Systems  All other systems reviewed and are negative.    Allergies  Review of patient's allergies indicates no known allergies.  Home Medications   Current Outpatient Rx  Name  Route  Sig  Dispense  Refill  . amLODipine (NORVASC) 10 MG tablet   Oral   Take 1 tablet (10 mg total) by mouth daily.   30 tablet   0     Please give patient directions to medications in s ...   . atorvastatin (LIPITOR) 20 MG  tablet   Oral   Take 20 mg by mouth daily.         . ciprofloxacin (CIPRO) 500 MG tablet   Oral   Take 500 mg by mouth 2 (two) times daily. For 10 days         . glipiZIDE (GLUCOTROL) 10 MG tablet   Oral   Take 10 mg by mouth daily.         . hydrochlorothiazide (MICROZIDE) 12.5 MG capsule   Oral   Take 1 capsule (12.5 mg total) by mouth daily.   30 capsule   3   . insulin glargine (LANTUS) 100 UNIT/ML injection   Subcutaneous   Inject 28 Units into the skin at bedtime.         . irbesartan (AVAPRO) 150 MG tablet   Oral   Take 1 tablet (150 mg total) by mouth daily.   30 tablet   6   . metFORMIN (GLUCOPHAGE) 850 MG tablet   Oral   Take 850 mg by mouth 3 (three) times daily.         . metoprolol succinate (TOPROL XL) 50 MG 24 hr tablet   Oral   Take 1 tablet (50 mg total) by mouth daily.   30 tablet   6     Please give patient directions to medications in s ...   Marland Kitchen  naproxen (NAPROSYN) 500 MG tablet   Oral   Take 500 mg by mouth 2 (two) times daily with a meal.         . guaiFENesin (MUCINEX) 600 MG 12 hr tablet   Oral   Take 2 tablets (1,200 mg total) by mouth 2 (two) times daily as needed for cough or to loosen phlegm.   30 tablet   0   . oxyCODONE-acetaminophen (PERCOCET/ROXICET) 5-325 MG per tablet   Oral   Take 2 tablets by mouth every 4 (four) hours as needed for severe pain.   20 tablet   0    BP 113/70  Pulse 76  Temp(Src) 98.2 F (36.8 C) (Oral)  Resp 18  Ht 5\' 6"  (1.676 m)  Wt 255 lb (115.667 kg)  BMI 41.18 kg/m2  SpO2 99% Physical Exam  Constitutional: He is oriented to person, place, and time. He appears well-developed and well-nourished.  HENT:  Head: Normocephalic and atraumatic.  Nose: Nose normal.  Mouth/Throat: Oropharynx is clear and moist.  Nasal congestion  Eyes: Conjunctivae and EOM are normal. Pupils are equal, round, and reactive to light.  Neck: Normal range of motion. Neck supple. No JVD present. No tracheal  deviation present. No thyromegaly present.  Cardiovascular: Normal rate, regular rhythm, normal heart sounds and intact distal pulses.  Exam reveals no gallop and no friction rub.   No murmur heard. Pulmonary/Chest: Effort normal and breath sounds normal. No stridor. No respiratory distress. He has no wheezes. He has no rales. He exhibits no tenderness.  Abdominal: Soft. Bowel sounds are normal. He exhibits no distension and no mass. There is no tenderness. There is no rebound and no guarding.  Musculoskeletal: Normal range of motion. He exhibits tenderness (mild tenderness to SI joints bilaterally reproducing pain). He exhibits no edema.  Lymphadenopathy:    He has no cervical adenopathy.  Neurological: He is alert and oriented to person, place, and time. He exhibits normal muscle tone. Coordination normal.  Skin: Skin is warm and dry. No rash noted. No erythema. No pallor.  Psychiatric: He has a normal mood and affect. His behavior is normal. Judgment and thought content normal.    ED Course  Procedures (including critical care time) Labs Review Labs Reviewed  RAPID STREP SCREEN  CULTURE, GROUP A STREP   Imaging Review No results found.  EKG Interpretation   None       MDM   1. URI (upper respiratory infection)   2. Back pain   3. Hypertension    47 year old male with URI.  Patient prescribed cough medicine, and Percocet for his back pain.  Instructed to followup with primary care Dr. for recheck.  Advised it is very important for him to take his blood pressure medicine.  Instructions given both in [place] and [place].  Patient acknowledged understanding.    Olivia Mackie, MD 11/13/13 [phone]

## 2013-11-15 ENCOUNTER — Ambulatory Visit (INDEPENDENT_AMBULATORY_CARE_PROVIDER_SITE_OTHER): Payer: Medicaid Other | Admitting: Cardiology

## 2013-11-15 ENCOUNTER — Encounter: Payer: Self-pay | Admitting: Cardiology

## 2013-11-15 VITALS — BP 140/70 | HR 76 | Ht 66.0 in | Wt 248.0 lb

## 2013-11-15 DIAGNOSIS — E139 Other specified diabetes mellitus without complications: Secondary | ICD-10-CM

## 2013-11-15 DIAGNOSIS — E1129 Type 2 diabetes mellitus with other diabetic kidney complication: Secondary | ICD-10-CM

## 2013-11-15 DIAGNOSIS — E1122 Type 2 diabetes mellitus with diabetic chronic kidney disease: Secondary | ICD-10-CM

## 2013-11-15 DIAGNOSIS — N183 Type 2 diabetes mellitus with diabetic chronic kidney disease: Secondary | ICD-10-CM

## 2013-11-15 DIAGNOSIS — R079 Chest pain, unspecified: Secondary | ICD-10-CM

## 2013-11-15 DIAGNOSIS — J069 Acute upper respiratory infection, unspecified: Secondary | ICD-10-CM

## 2013-11-15 DIAGNOSIS — E119 Type 2 diabetes mellitus without complications: Secondary | ICD-10-CM

## 2013-11-15 DIAGNOSIS — I10 Essential (primary) hypertension: Secondary | ICD-10-CM

## 2013-11-15 LAB — CULTURE, GROUP A STREP

## 2013-11-15 NOTE — Assessment & Plan Note (Signed)
Much improved

## 2013-11-15 NOTE — Assessment & Plan Note (Signed)
Improving, is also on cipro, instructed on Musinex.

## 2013-11-15 NOTE — Assessment & Plan Note (Signed)
Followed by PCP

## 2013-11-15 NOTE — Patient Instructions (Addendum)
We will schedule your stress test now that your BP is controlled.  Congrats on your BP control!  Much improved.   Musinex is ok to take for cold symptoms  Just no  "D" after the work.  No pseudoephedrine.  Follow up with Dr. Tresa Endo fjor results of stress test and to follow your blood pressure.

## 2013-11-15 NOTE — Assessment & Plan Note (Signed)
Will schedule stress myoview now BP is controlled.  Will follow up with Dr. Tresa Endo for results.

## 2013-11-15 NOTE — Progress Notes (Signed)
11/15/2013   PCP: Provider Not In System   Chief Complaint  Patient presents with  . Follow-up    rov one week    Primary Cardiologist: Dr. Tresa Endo  HPI: [name] admits to a several year history of hypertension. He has a history of hyperlipidemia as well as diabetes mellitus. He was hospitalized at [facility] after presenting with chest pain and accelerated hypertension. Of note, his diabetes was uncontrolled and his hemoglobin was 10.1. He was started on Lantus Glucophage and Glucotrol. He also had a urinary tract infection which grew Klebsiella sensitive to Levaquin for which she was treated. He had stage II hypertension and amlodipine 10 mg was added. Previously he had been on lisinopril at 20 mg and this was reduced to 10 mg due to worsening renal function with a creatinine that had risen to 1.87. It was felt that his chest pain most likely was musculoskeletal. A renal ultrasound did not show evidence for hydronephrosis. He had mildly echogenic renal parenchyma bilaterally suggesting underlying medical renal disease. His initial white count was 20.6 which improved to 10.9 with therapy. Cardiac enzymes were negative. A glucose measurement was 306 on one occasion. He underwent a 2-D echo Doppler study which showed an ejection fraction of 55-60% with normal wall motion. Subsequent to his hospitalization he saw Dr. Lerry Liner. He was referred for cardiology evaluation with Dr. Tresa Endo and it was requested to perform a stress test in light of his cardiac risk factors.   There has been a delay in scheduling, due to uncontrolled HTN.  Last visit 1 week ago, we had discussion on importance of medications to prevent renal disease.  Pt is back today for follow up.  BP is much improved.  He is taking meds without problems.  He does not check accuchecks.  He continues with numbness in lower legs, most likely neuropathy.  He has an interpreter today and I asked pt to follow up  with PCP.  Pt's back pain and headache resolved.  Still with URI though now on antibiotic.        No Known Allergies  Current Outpatient Prescriptions  Medication Sig Dispense Refill  . amLODipine (NORVASC) 10 MG tablet Take 1 tablet (10 mg total) by mouth daily.  30 tablet  0  . atorvastatin (LIPITOR) 20 MG tablet Take 20 mg by mouth daily.      . ciprofloxacin (CIPRO) 500 MG tablet Take 500 mg by mouth 2 (two) times daily. For 10 days      . glipiZIDE (GLUCOTROL) 10 MG tablet Take 10 mg by mouth daily.      Marland Kitchen guaiFENesin (MUCINEX) 600 MG 12 hr tablet Take 2 tablets (1,200 mg total) by mouth 2 (two) times daily as needed for cough or to loosen phlegm.  30 tablet  0  . hydrochlorothiazide (MICROZIDE) 12.5 MG capsule Take 1 capsule (12.5 mg total) by mouth daily.  30 capsule  3  . insulin glargine (LANTUS) 100 UNIT/ML injection Inject 28 Units into the skin at bedtime.      . irbesartan (AVAPRO) 150 MG tablet Take 1 tablet (150 mg total) by mouth daily.  30 tablet  6  . metFORMIN (GLUCOPHAGE) 850 MG tablet Take 850 mg by mouth 3 (three) times daily.      . metoprolol succinate (TOPROL XL) 50 MG 24 hr tablet Take 1 tablet (50 mg total) by mouth daily.  30 tablet  6  .  naproxen (NAPROSYN) 500 MG tablet Take 500 mg by mouth 2 (two) times daily with a meal.      . oxyCODONE-acetaminophen (PERCOCET/ROXICET) 5-325 MG per tablet Take 2 tablets by mouth every 4 (four) hours as needed for severe pain.  20 tablet  0   No current facility-administered medications for this visit.    Past Medical History  Diagnosis Date  . Diabetes mellitus without complication   . Hypertension     Past Surgical History  Procedure Laterality Date  . Appendectomy      ZOX:WRUEAVW:+ cold no fevers,  weight down 5 pounds Skin:no rashes or ulcers HEENT:no blurred vision, no congestion CV:see HPI PUL:see HPI GI:no diarrhea constipation or melena, no indigestion GU:no hematuria, no dysuria MS:no joint pain,  no claudication, + numbness in legs possible neuropathy- to follow with PCP,  Back pain resolved Neuro:no syncope, no lightheadedness Endo:+ diabetes followed by PCP, pt does not check his accuchecks, no thyroid disease  PHYSICAL EXAM BP 140/70  Pulse 76  Ht 5\' 6"  (1.676 m)  Wt 248 lb (112.492 kg)  BMI 40.05 kg/m2 General:Pleasant affect, NAD Skin:Warm and dry, brisk capillary refill Heart:S1S2 RRR without murmur, gallup, rub or click Lungs:clear without rales, rhonchi, or wheezes UJW:JXBJ, non tender, + BS, do not palpate liver spleen or masses Ext:no lower ext edema, 2+ pedal pulses, 2+ radial pulses Neuro:alert and oriented X 3, MAE.    ASSESSMENT AND PLAN Hypertension Much improved.    Chest pain Will schedule stress myoview now BP is controlled.  Will follow up with Dr. Tresa Endo for results.  Diabetes mellitus with stage 3 chronic kidney disease Followed by PCP  URI (upper respiratory infection) Improving, is also on cipro, instructed on Musinex.

## 2013-11-22 ENCOUNTER — Ambulatory Visit (HOSPITAL_COMMUNITY)
Admission: RE | Admit: 2013-11-22 | Discharge: 2013-11-22 | Disposition: A | Discharge status: Home / Self Care | Payer: Medicaid Other | Source: Ambulatory Visit | Attending: Cardiovascular Disease | Admitting: Cardiovascular Disease

## 2013-11-22 DIAGNOSIS — E119 Type 2 diabetes mellitus without complications: Secondary | ICD-10-CM | POA: Insufficient documentation

## 2013-11-22 DIAGNOSIS — R0989 Other specified symptoms and signs involving the circulatory and respiratory systems: Secondary | ICD-10-CM | POA: Insufficient documentation

## 2013-11-22 DIAGNOSIS — E139 Other specified diabetes mellitus without complications: Secondary | ICD-10-CM

## 2013-11-22 DIAGNOSIS — I10 Essential (primary) hypertension: Secondary | ICD-10-CM

## 2013-11-22 DIAGNOSIS — Z794 Long term (current) use of insulin: Secondary | ICD-10-CM | POA: Insufficient documentation

## 2013-11-22 DIAGNOSIS — R5381 Other malaise: Secondary | ICD-10-CM | POA: Insufficient documentation

## 2013-11-22 DIAGNOSIS — N183 Chronic kidney disease, stage 3 unspecified: Secondary | ICD-10-CM | POA: Insufficient documentation

## 2013-11-22 DIAGNOSIS — R0609 Other forms of dyspnea: Secondary | ICD-10-CM | POA: Insufficient documentation

## 2013-11-22 DIAGNOSIS — R079 Chest pain, unspecified: Secondary | ICD-10-CM | POA: Insufficient documentation

## 2013-11-22 DIAGNOSIS — E669 Obesity, unspecified: Secondary | ICD-10-CM | POA: Insufficient documentation

## 2013-11-22 DIAGNOSIS — J45909 Unspecified asthma, uncomplicated: Secondary | ICD-10-CM | POA: Insufficient documentation

## 2013-11-22 DIAGNOSIS — I129 Hypertensive chronic kidney disease with stage 1 through stage 4 chronic kidney disease, or unspecified chronic kidney disease: Secondary | ICD-10-CM | POA: Insufficient documentation

## 2013-11-22 DIAGNOSIS — G609 Hereditary and idiopathic neuropathy, unspecified: Secondary | ICD-10-CM | POA: Insufficient documentation

## 2013-11-22 DIAGNOSIS — M549 Dorsalgia, unspecified: Secondary | ICD-10-CM | POA: Insufficient documentation

## 2013-11-22 MED ORDER — REGADENOSON 0.4 MG/5ML IV SOLN
0.4000 mg | Freq: Once | INTRAVENOUS | Status: AC
  Administered 2013-11-22: 0.4 mg via INTRAVENOUS

## 2013-11-22 MED ORDER — TECHNETIUM TC 99M SESTAMIBI GENERIC - CARDIOLITE
30.8000 | Freq: Once | INTRAVENOUS | Status: AC | PRN
  Administered 2013-11-22: 30.8 via INTRAVENOUS

## 2013-11-22 MED ORDER — TECHNETIUM TC 99M SESTAMIBI GENERIC - CARDIOLITE
10.2000 | Freq: Once | INTRAVENOUS | Status: AC | PRN
  Administered 2013-11-22: 10 via INTRAVENOUS

## 2013-11-22 NOTE — Procedures (Addendum)
[facility] [address] [place] [address] [place] [address] [phone]  Cardiology Nuclear Med Study  Lorry Nettleton is a 47 y.o. male     MRN : [id number]     DOB: [birth year]  Procedure Date: 11/22/2013  Nuclear Med Background Indication for Stress Test:  Evaluation for Ischemia and Abnormal EKG History:  Asthma and CKD stage III;peripheral neuropathy Cardiac Risk Factors: Hypertension, IDDM Type 2, Lipids and Obesity  Symptoms:  Chest Pain, DOE, Fatigue and back pain   Nuclear Pre-Procedure Caffeine/Decaff Intake:  8:00pm NPO After: 8:00am   IV Site: R Hand  IV 0.9% NS with Angio Cath:  22g  Chest Size (in):  46"  IV Started by: Emmit Pomfret, RN  Height: 5\' 6"  (1.676 m)  Cup Size: n/a  BMI:  Body mass index is 40.05 kg/(m^2). Weight:  248 lb (112.492 kg)   Tech Comments:  Patient was not able to achieve his target heart rate due to extreme shortness of breath, generalized fatigue and leg fatigue. He requested to stop treadmill.    Nuclear Med Study 1 or 2 day study: 1 day  Stress Test Type:   Lexiscan  Order Authorizing Provider:  Nicki Guadalajara, MD   Resting Radionuclide: Technetium 32m Sestamibi  Resting Radionuclide Dose: 10.2 mCi   Stress Radionuclide:  Technetium 13m Sestamibi  Stress Radionuclide Dose: 30.8 mCi           Stress Protocol Rest HR: 76 Stress HR: 96  Rest BP: 147/79 Stress BP: 147/79  Exercise Time (min): n/a METS: n/a          Dose of Adenosine (mg):  n/a Dose of Lexiscan: 0.4 mg  Dose of Atropine (mg): n/a Dose of Dobutamine: n/a mcg/kg/min (at max HR)  Stress Test Technologist: Ernestene Mention, CCT Nuclear Technologist: Gonzella Lex, CNMT   Rest Procedure:  Myocardial perfusion imaging was performed at rest 45 minutes following the intravenous administration of Technetium 85m Sestamibi. Stress Procedure:  The patient received IV Lexiscan 0.4 mg over 15-seconds.  Technetium 69m Sestamibi injected at  30-seconds.  There were no significant changes with Lexiscan.  Quantitative spect images were obtained after a 45 minute delay.  Transient Ischemic Dilatation (Normal <1.22):  1.01 Lung/Heart Ratio (Normal <0.45):  0.27 QGS EDV:  130 ml QGS ESV:  67 ml LV Ejection Fraction: 48%  Signed by      Rest ECG: NSR with very small nondiagnostic Q waves inferiorly.  Stress ECG: No significant ST segment change suggestive of ischemia.  QPS Raw Data Images:  Normal; no motion artifact; normal heart/lung ratio. Stress Images:  Normal homogeneous uptake in all areas of the myocardium. Rest Images:  Normal homogeneous uptake in all areas of the myocardium. Subtraction (SDS):  Normal  Impression Exercise Capacity:  Lexiscan with no exercise. BP Response:  Normal blood pressure response. Clinical Symptoms:  Mild shortness of breath ECG Impression:  No significant ST segment change suggestive of ischemia. Comparison with Prior Nuclear Study: No previous nuclear study performed  Overall Impression:  Low risk nuclear study without eveidence for ischemia.  LV Wall Motion:  Mild  LV Dysfunction, EF 48% with mild distal septal-inferior hypokinesis.   Lennette Bihari, MD  11/22/2013 1:47 PM

## 2013-12-05 ENCOUNTER — Ambulatory Visit: Payer: Medicaid Other | Admitting: Cardiovascular Disease

## 2014-01-16 ENCOUNTER — Ambulatory Visit (INDEPENDENT_AMBULATORY_CARE_PROVIDER_SITE_OTHER): Payer: Medicaid Other | Admitting: Cardiovascular Disease

## 2014-02-22 ENCOUNTER — Encounter: Payer: Self-pay | Admitting: Cardiovascular Disease

## 2014-02-22 ENCOUNTER — Other Ambulatory Visit: Payer: Self-pay | Admitting: *Deleted

## 2014-02-22 ENCOUNTER — Ambulatory Visit (INDEPENDENT_AMBULATORY_CARE_PROVIDER_SITE_OTHER): Payer: Medicaid Other | Admitting: Cardiovascular Disease

## 2014-02-22 VITALS — BP 186/90 | HR 63 | Ht 66.0 in | Wt 257.0 lb

## 2014-02-22 DIAGNOSIS — N183 Chronic kidney disease, stage 3 unspecified: Secondary | ICD-10-CM

## 2014-02-22 DIAGNOSIS — R079 Chest pain, unspecified: Secondary | ICD-10-CM

## 2014-02-22 DIAGNOSIS — N390 Urinary tract infection, site not specified: Secondary | ICD-10-CM

## 2014-02-22 DIAGNOSIS — E1122 Type 2 diabetes mellitus with diabetic chronic kidney disease: Secondary | ICD-10-CM

## 2014-02-22 DIAGNOSIS — E1129 Type 2 diabetes mellitus with other diabetic kidney complication: Secondary | ICD-10-CM

## 2014-02-22 DIAGNOSIS — I10 Essential (primary) hypertension: Secondary | ICD-10-CM

## 2014-02-22 MED ORDER — IRBESARTAN-HYDROCHLOROTHIAZIDE 300-12.5 MG PO TABS: 1.0000 | ORAL_TABLET | Freq: Every day | ORAL | Status: DC

## 2014-02-22 NOTE — Progress Notes (Signed)
Patient ID: Phillip Murphy, male   DOB: [birth year], 48 y.o.   MRN: [id number]     HPI Phillip Murphy is a 2 -year-old [place] gentleman who was initially referred for cardiology evaluation through the courtesy of Dr. Lerry Liner for evaluation of labile hypertension as well as chest pain. He presents now for followup Cardiologic evaluation.  Phillip Murphy admits to a several year history of hypertension and has a history of hyperlipidemia as well as diabetes mellitus. He was hospitalized at [facility] in the Fall after presenting with chest pain and accelerated hypertension. His diabetes was uncontrolled and his hemoglobin was 10.1. He was started on Lantus Glucophage and Glucotrol. He also had a urinary tract infection which grew Klebsiella sensitive to Levaquin. He had stage II hypertension and amlodipine 10 mg was added. Previously he had been on lisinopril at 20 mg and this was reduced to 10 mg due to worsening renal function with a creatinine that had risen to 1.87. It was felt that his chest pain most likely was musculoskeletal. A renal ultrasound did not show evidence for hydronephrosis. He had mildly echogenic renal parenchyma bilaterally suggesting underlying medical renal disease. His initial white count was 20.6 which improved to 10.9 with therapy. Cardiac enzymes were negative. A glucose measurement was 306 on one occasion. He underwent a 2-D echo Doppler study which showed an ejection fraction of 55-60% with normal wall motion. Subsequent to his hospitalization he saw Dr. Lerry Liner. He now is referred for cardiology evaluation was requested also to perform a stress test in light of his cardiac risk factors.  The chest pain that Phillip Murphy described is not typically exertionally precipitated. At times it's constant. At other times he has noticed a tightness. He is bothered by peripheral neuropathy and apparently was started on gabapentin in the hospital with plus minus benefit. He does  have a history of obesity.  Over the past several months, he has been maintained on amlodipine 10 mg, irbesartan 150 mg, and Toprol-XL 50 mg for blood pressure control. He did undergo a nuclear perfusion study on 11/22/2013 which was interpreted as low risk and had a post stress ejection fraction of 48%. Perfusion was normal. There was a subtle suggestion of possible mild distal septal inferior hypokinesis.  He denies recent chest pain. On questioning, the patient adds salt to almost all his food. There is a mild shortness of breath. He denies palpitations. He denies PND or orthopnea. He has noted some ankle swelling bilaterally. He presents for evaluation.  Past Medical History  Diagnosis Date  . Diabetes mellitus without complication   . Hypertension     Past Surgical History  Procedure Laterality Date  . Appendectomy      No Known Allergies  Current Outpatient Prescriptions  Medication Sig Dispense Refill  . amLODipine (NORVASC) 10 MG tablet Take 1 tablet (10 mg total) by mouth daily.  30 tablet  0  . atorvastatin (LIPITOR) 20 MG tablet Take 20 mg by mouth daily.      Marland Kitchen glipiZIDE (GLUCOTROL) 10 MG tablet Take 10 mg by mouth daily.      Marland Kitchen guaiFENesin (MUCINEX) 600 MG 12 hr tablet Take 2 tablets (1,200 mg total) by mouth 2 (two) times daily as needed for cough or to loosen phlegm.  30 tablet  0  . insulin glargine (LANTUS) 100 UNIT/ML injection Inject 28 Units into the skin at bedtime.      . metFORMIN (GLUCOPHAGE) 850 MG tablet Take 850  mg by mouth 3 (three) times daily.      . metoprolol succinate (TOPROL XL) 50 MG 24 hr tablet Take 1 tablet (50 mg total) by mouth daily.  30 tablet  6  . naproxen (NAPROSYN) 500 MG tablet Take 500 mg by mouth 2 (two) times daily with a meal.      . oxyCODONE-acetaminophen (PERCOCET/ROXICET) 5-325 MG per tablet Take 2 tablets by mouth every 4 (four) hours as needed for severe pain.  20 tablet  0  . irbesartan-hydrochlorothiazide (AVALIDE) 300-12.5 MG  per tablet Take 1 tablet by mouth daily.  30 tablet  6   No current facility-administered medications for this visit.    Social history is notable that he was born in [place]. His mother was 3212 or 48 years old when she had birth to him. [place] has been living in [place] over [place]. 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 He denies recent tobacco use. He denies excessive alcohol intake. He does take an occasional beer.  Family History  Problem Relation Age of Onset  . Hypertension Mother   . Diabetes Mother   . Diabetes Father   . Diabetes Sister   . Diabetes Brother   . Diabetes Maternal Aunt   . Diabetes Brother   . Diabetes Brother     ROS is negative for fever chills or night sweats. He denies any skin rashes. He denies any acute visual changes. He is unaware of adenopathy. He does note some mild shortness of breath with activity. He does note chest discomfort which seems at times to be more constant; this has improved, and is nonexertional.. At other times it is short lived. It is not classically precipitated by activity. He denies PND orthopnea. He denies cough or increased sputum production or wheezing. He does admit to being overweight for many years. He denies change in bowel or bladder habits. He is status post a recent Klebsiella UTI. He denies any known problems with his prostate.He denies dysuria or urinary frequency. He denies any rectal bleeding. He does admit to peripheral neuropathy symptoms that are bothersome. He denies claudication. He denies myalgias. He's unaware of thyroid abnormalities.  Diabetes has been poorly controlled. He denies psychological issues. From a sleep perspective, he does snore. He denies residual daytime sleepiness. He denies restless legs. Other comprehensive system 14 review is negative.  PE BP 186/90  Pulse 63  Ht 5\' 6"  (1.676 m)  Wt 257 lb (116.574 kg)  BMI 41.50 kg/m2 146/86 when rechecked by me General:  Alert, oriented, no distress.  Skin: normal turgor, no rashes HEENT: Normocephalic, atraumatic. Pupils round and reactive; sclera anicteric; Fundi arterial narrowing but no hemorrhages or exudates. Nose without nasal septal hypertrophy Mouth/Parynx benign; Mallinpatti scale 3 Neck: No JVD, no carotid bruits with normal carotid upstroke No adenopathy Lungs: clear to ausculatation and percussion; no wheezing or rales Chest wall with tenderness over the left costochondral region and laterally in the anterior axillary line. Heart: RRR, s1 s2 normal no S3 gallop or S4. 1/6 systolic murmur. No diastolic murmur. No rubs toes or heels. Abdomen: soft, nontender; no hepatosplenomehaly, BS+; abdominal aorta nontender and not dilated by palpation. Pulses 2+ Extremities: Trace to 1+ ankle edema no clubbinbg cyanosis, Homan's sign negative  Neurologic:  cranial nerves appear grossly intact. Peripheral neuropathy Psychological: Normal affect and mood appear   Prior 10/06/2013 ECG: Sinus bradycardia at 58 beats per minute. PR interval 150 ms.  QTc interval 44 ms. No significant ST abnormalities.  LABS:  BMET    Component Value Date/Time   NA 137 11/04/2013 1120   K 4.7 11/04/2013 1120   CL 100 11/04/2013 1120   CO2 26 11/04/2013 1120   GLUCOSE 322* 11/04/2013 1120   BUN 30* 11/04/2013 1120   CREATININE 1.47* 11/04/2013 1120   CALCIUM 9.4 11/04/2013 1120   GFRNONAA 55* 11/04/2013 1120   GFRAA 64* 11/04/2013 1120     Hepatic Function Panel     Component Value Date/Time   PROT 6.7 07/18/2013 2343   ALBUMIN 2.7* 07/18/2013 2343   AST 14 07/18/2013 2343   ALT 14 07/18/2013 2343   ALKPHOS 73 07/18/2013 2343   BILITOT 0.3 07/18/2013 2343     CBC    Component Value Date/Time   WBC 13.5* 11/04/2013 1120   RBC 4.75 11/04/2013 1120   HGB 14.3 11/04/2013 1120   HCT 40.1 11/04/2013 1120   PLT 272 11/04/2013 1120   MCV 84.4 11/04/2013 1120   MCH 30.1 11/04/2013 1120   MCHC 35.7 11/04/2013 1120    RDW 12.3 11/04/2013 1120   LYMPHSABS 2.9 11/04/2013 1120   MONOABS 0.7 11/04/2013 1120   EOSABS 0.2 11/04/2013 1120   BASOSABS 0.1 11/04/2013 1120     BNP No results found for this basename: probnp    Lipid Panel  No results found for this basename: chol,  trig,  hdl,  cholhdl,  vldl,  ldlcalc     RADIOLOGY: No results found.   ASSESSMENT AND PLAN: Mr. Mayol presents has a history of stage II hypertension. His blood pressure today remains elevated and his labile. When he was hospitalized in August, his creatinine had risen to 1.87. I suspect this was a combination of his diabetes mellitus, hypertension, and urinary tract infection. He was treated for Klebsiella UTI. His dose of ACE inhibitor was reduced in light of his renal function. Presently, he has felt improved. He denies recent chest pain. His blood pressure continues to be labile. His creatinine in August 2014 was 1.31. I am now further adjusting his ARB therapy and we'll change his current irbesartan 150 mg 2 irbesartan HCT 300/12.5 mg. This should provide both blood pressure control as well as benefit with reference to his ankle edema. I a lengthy discussion with both he and his interpreter today. We discussed the importance of sodium restriction. He's been adding salt to all his foods. He works at a [employer]. We discussed weight loss with his morbid obesity and BMI 41.5. I have recommended followup renal profile following the increased irbesartan dose.  He now is on  Glucophage 853 times a day, Glucotrol 10 mg daily in addition to his Lantus insulin for his diabetes. I suggested he see Dr.  Mayford Knife in approximately 6-8 weeks for followup evaluation. I will see him in 6 months for cardiology reevaluation.     Phillip Bihari, MD, [facility] 02/22/2014 10:12 AM

## 2014-02-22 NOTE — Patient Instructions (Signed)
Your physician has recommended you make the following change in your medication: STOP THE IRBESARTAN. START THE NEW PRESCRIPTION FOR THE IRBESARTAN-HCT 300-12.5.  [facility]   Your physician recommends that you schedule a follow-up appointment in: 6 months.

## 2014-02-23 ENCOUNTER — Encounter: Payer: Self-pay | Admitting: *Deleted

## 2014-03-07 LAB — BASIC METABOLIC PANEL
BUN: 37 mg/dL — ABNORMAL HIGH (ref 6–23)
CO2: 24 mEq/L (ref 19–32)
Calcium: 8.6 mg/dL (ref 8.4–10.5)
Chloride: 108 mEq/L (ref 96–112)
Creat: 1.73 mg/dL — ABNORMAL HIGH (ref 0.50–1.35)
Glucose, Bld: 243 mg/dL — ABNORMAL HIGH (ref 70–99)
POTASSIUM: 4.6 meq/L (ref 3.5–5.3)
SODIUM: 139 meq/L (ref 135–145)

## 2014-03-30 ENCOUNTER — Emergency Department (HOSPITAL_COMMUNITY): Payer: Medicaid Other

## 2014-03-30 ENCOUNTER — Encounter (HOSPITAL_COMMUNITY): Payer: Self-pay | Admitting: Emergency Medicine

## 2014-03-30 ENCOUNTER — Emergency Department (HOSPITAL_COMMUNITY)
Admission: EM | Admit: 2014-03-30 | Discharge: 2014-03-30 | Disposition: A | Discharge status: Home / Self Care | Payer: Medicaid Other | Attending: Emergency Medicine | Admitting: Emergency Medicine

## 2014-03-30 DIAGNOSIS — M545 Low back pain, unspecified: Secondary | ICD-10-CM | POA: Insufficient documentation

## 2014-03-30 DIAGNOSIS — I10 Essential (primary) hypertension: Secondary | ICD-10-CM | POA: Insufficient documentation

## 2014-03-30 DIAGNOSIS — Z794 Long term (current) use of insulin: Secondary | ICD-10-CM | POA: Insufficient documentation

## 2014-03-30 DIAGNOSIS — R1110 Vomiting, unspecified: Secondary | ICD-10-CM | POA: Insufficient documentation

## 2014-03-30 DIAGNOSIS — Z79899 Other long term (current) drug therapy: Secondary | ICD-10-CM | POA: Insufficient documentation

## 2014-03-30 DIAGNOSIS — E119 Type 2 diabetes mellitus without complications: Secondary | ICD-10-CM | POA: Insufficient documentation

## 2014-03-30 MED ORDER — CYCLOBENZAPRINE HCL 10 MG PO TABS: 10.0000 mg | ORAL_TABLET | Freq: Two times a day (BID) | ORAL | Status: DC | PRN

## 2014-03-30 MED ORDER — HYDROCODONE-ACETAMINOPHEN 5-325 MG PO TABS: 1.0000 | ORAL_TABLET | ORAL | Status: DC | PRN

## 2014-03-30 MED ORDER — HYDROCODONE-ACETAMINOPHEN 5-325 MG PO TABS
1.0000 | ORAL_TABLET | Freq: Once | ORAL | Status: AC
  Administered 2014-03-30: 1 via ORAL
  Filled 2014-03-30: qty 1

## 2014-03-30 NOTE — ED Notes (Signed)
Pt. reports chronic low back pain for several weeks , fell 1 month ago , ambulatory / denies urinary discomfort . Pain got worse these past several days , denies recent fall or injury.

## 2014-03-30 NOTE — ED Provider Notes (Signed)
CSN: [id number]     Arrival date & time 03/30/14  2102 History  This chart was scribed for non-physician practitioner, Elpidio AnisShari Ritter Helsley, PA-C working with Audree CamelScott T Goldston, MD by Greggory StallionKayla Andersen, ED scribe. This patient was seen in room TR09C/TR09C and the patient's care was started at 10:17 PM.   Chief Complaint  Patient presents with  . Back Pain   The history is provided by the patient. A language interpreter was used (family member).   HPI Comments: Phillip Murphy is a 48 y.o. male who presents to the Emergency Department complaining of lower back pain that started about one week ago. Pt states he had a fall about 4 weeks ago that made the pain flare up. He states the pain has worsened over the last several days. Pain does not radiate into legs. Pt had an episode of emesis last night that he thinks is due to a medication he was taking. Denies abdominal pain, bowel or bladder incontinence.    Past Medical History  Diagnosis Date  . Diabetes mellitus without complication   . Hypertension    Past Surgical History  Procedure Laterality Date  . Appendectomy     Family History  Problem Relation Age of Onset  . Hypertension Mother   . Diabetes Mother   . Diabetes Father   . Diabetes Sister   . Diabetes Brother   . Diabetes Maternal Aunt   . Diabetes Brother   . Diabetes Brother    History  Substance Use Topics  . Smoking status: Never Smoker   . Smokeless tobacco: Never Used  . Alcohol Use: Yes    Review of Systems  Gastrointestinal: Positive for vomiting. Negative for abdominal pain.  Genitourinary:       Negative for bowel or bladder incontinence.  Musculoskeletal: Positive for back pain.  All other systems reviewed and are negative.  Allergies  Review of patient's allergies indicates no known allergies.  Home Medications   Prior to Admission medications   Medication Sig Start Date End Date Taking? Authorizing Provider  amLODipine (NORVASC) 10 MG tablet Take 1 tablet (10  mg total) by mouth daily. 11/04/13   Tiffany Irine SealG Greene, PA-C  atorvastatin (LIPITOR) 20 MG tablet Take 20 mg by mouth daily.    Historical Provider, MD  glipiZIDE (GLUCOTROL) 10 MG tablet Take 10 mg by mouth daily.    Historical Provider, MD  guaiFENesin (MUCINEX) 600 MG 12 hr tablet Take 2 tablets (1,200 mg total) by mouth 2 (two) times daily as needed for cough or to loosen phlegm. 11/13/13   Olivia Mackielga M Otter, MD  insulin glargine (LANTUS) 100 UNIT/ML injection Inject 28 Units into the skin at bedtime.    Historical Provider, MD  irbesartan-hydrochlorothiazide (AVALIDE) 300-12.5 MG per tablet Take 1 tablet by mouth daily. 02/22/14   Lennette Biharihomas A Kelly, MD  metFORMIN (GLUCOPHAGE) 850 MG tablet Take 850 mg by mouth 3 (three) times daily.    Historical Provider, MD  metoprolol succinate (TOPROL XL) 50 MG 24 hr tablet Take 1 tablet (50 mg total) by mouth daily. 11/08/13   Nada BoozerLaura Ingold, NP  naproxen (NAPROSYN) 500 MG tablet Take 500 mg by mouth 2 (two) times daily with a meal.    Historical Provider, MD  oxyCODONE-acetaminophen (PERCOCET/ROXICET) 5-325 MG per tablet Take 2 tablets by mouth every 4 (four) hours as needed for severe pain. 11/13/13   Olivia Mackielga M Otter, MD   BP 172/84  Pulse 80  Temp(Src) 98.3 F (36.8 C) (Oral)  Resp  18  SpO2 97%  Physical Exam  Nursing note and vitals reviewed. Constitutional: He is oriented to person, place, and time. He appears well-developed and well-nourished. No distress.  HENT:  Head: Normocephalic and atraumatic.  Eyes: EOM are normal.  Neck: Neck supple. No tracheal deviation present.  Cardiovascular: Normal rate.   Pulmonary/Chest: Effort normal. No respiratory distress.  Abdominal: There is no tenderness.  Musculoskeletal: Normal range of motion.  Bilateral paralumbar tenderness. No swelling or discoloration. FROM lower extremities.   Neurological: He is alert and oriented to person, place, and time.  Skin: Skin is warm and dry.  Psychiatric: He has a normal mood  and affect. His behavior is normal.    ED Course  Procedures (including critical care time)  DIAGNOSTIC STUDIES: Oxygen Saturation is 97% on RA, normal by my interpretation.    COORDINATION OF CARE: 10:19 PM-Discussed treatment plan which includes a muscle relaxer and a short course of narcotic pain medication with pt at bedside and pt agreed to plan.  Labs Review Labs Reviewed - No data to display  Imaging Review Dg Lumbar Spine Complete  03/30/2014   CLINICAL DATA:  Low back pain for 1 month increased in severity for past week, history hypertension, diabetes  EXAM: LUMBAR SPINE - COMPLETE 4+ VIEW  COMPARISON:  None  FINDINGS: Mild osseous demineralization.  Five non-rib-bearing lumbar vertebrae.  Vertebral body and disc space heights maintained.  Few scattered endplate spurs greatest at L1-L2.  No acute fracture, subluxation or bone destruction.  No spondylolysis.  Visualized pelvis intact.  IMPRESSION: Minimal degenerative disc disease changes.  No acute abnormalities.   Electronically Signed   By: Ulyses SouthwardMark  Boles M.D.   On: 03/30/2014 22:05     EKG Interpretation None      MDM   Final diagnoses:  None    1. Low back pain  No neurologic red flags; patient given pain medication. Discussed follow up for possible MRI if pain persists without improvement.  I personally performed the services described in this documentation, which was scribed in my presence. The recorded information has been reviewed and is accurate.  Arnoldo HookerShari A Quartez Lagos, PA-C 03/30/14 2234

## 2014-03-30 NOTE — Discharge Instructions (Signed)
Dolor de espalda en el adulto  (Back Pain, Adult)   El dolor de cintura es frecuente. Aproximadamente 1 de cada 5 personas lo sufren. La causa rara vez pone en peligro la vida. Con frecuencia mejora luego de algún tiempo. Alrededor de la mitad de las personas que sufren un inicio súbito de dolor de cintura, se sentirán mejor luego de 2 semanas. Aproximadamente 8 de cada 10 se sentirán mejor luego de 6 semanas.   CAUSAS   Algunas causas comunes son:   · Distensión de los músculos o ligamentos que sostienen la columna vertebral.  · Desgaste (degeneración) de los discos vertebrales.  · Artritis.  · Traumatismos directos en la espalda.  DIAGNÓSTICO   La mayor parte de las veces, la causa directa no se conoce. Sin embargo, el dolor puede tratarse efectivamente aún cuando no se conozca la causa. Una de las formas más precisas de asegurar que la causa del dolor no constituye un peligro es responder a las preguntas del médico acerca de su salud y sus síntomas. Si el médico necesita más información, podrá indicar análisis de laboratorio o realizar un diagnóstico por imágenes (radiografías o resonancia magnética). Sin embargo, aunque las imágenes muestren modificaciones, generalmente no es necesaria la cirugía.   INSTRUCCIONES PARA EL CUIDADO EN EL HOGAR   En algunas personas, el dolor de espalda vuelve. Como rara vez es peligroso, los pacientes pueden aprender a manejarlo ellos mismos.   · Manténgase activo. Si permanece sentado o de pie mucho tiempo en el mismo lugar, se tensiona la espalda.  No se siente, maneje ni se quede parado en un mismo lugar por más de 30 minutos. Realice caminatas cortas en superficies planas ni bien el dolor haya cedido. Trate de aumentar cada día el tiempo que camina .  · No se quede en la cama. Si hace reposo durante más de 1 o 2 días, puede retrasar la recuperación.  · No evite los ejercicios ni el trabajo. El cuerpo está hecho para moverse. No es peligroso estar activo, aunque le duela la  espalda. La espalda se curará más rápido si continúa sus actividades antes de que el dolor se vaya.  · Preste atención a su cuerpo cuando se incline y se levante. Muchas personas sienten menos molestias cuando levantan objetos si doblan las rodillas, mantienen la carga cerca del cuerpo y evitan torcerse. Generalmente, las posiciones más cómodas son las que ejercen menos tensión en la espalda en recuperación.  · Encuentre una posición cómoda para dormir. Utilice un colchón firme y recuéstese de costado. Doble ligeramente sus rodillas. Si se recuesta sobre su espalda, coloque una almohada debajo de sus rodillas.  · Tome sólo medicamentos de venta libre o recetados, según las indicaciones del médico. Los medicamentos de venta libre para calmar el dolor y reducir la inflamación, son los que en general más ayudan. El médico podrá prescribirle relajantes musculares. Estos medicamentos calman el dolor de modo que pueda retornar a sus actividades normales y a realizar ejercicios saludables.  · Aplique hielo sobre la zona lesionada.  · Ponga el hielo en una bolsa plástica.  · Colóquese una toalla entre la piel y la bolsa de hielo.  · Deje la bolsa de hielo durante 15 a 20 minutos 3 a 4 veces por día, durante los primeros 2 ó 3 días. Luego podrá alternar entre calor y hielo para reducir el dolor y los espasmos.  · Consulte a su médico si puede tratar de hacer ejercicios para la espalda y recibir un masaje suave. Pueden ser beneficiosos.  · Evite sentirse ansioso o   estresado. El estrés aumenta la tensión muscular y puede empeorar el dolor de espalda. Es importante reconocer cuando está ansioso o estresado y aprender la forma de controlarlos. El ejercicio es una gran opción.  SOLICITE ATENCIÓN MÉDICA SI:   · Siente un dolor que no se alivia con reposo o medicamentos.  · El dolor no mejora en 1 semana.  · Desarrolla nuevos síntomas.  · No se siente bien en general.  SOLICITE ATENCIÓN MÉDICA DE INMEDIATO SI:  · Siente un dolor  que se irradia desde la espalda hacia sus piernas.  · Desarrolla nuevos problemas en el intestino o la vejiga.  · Siente debilidad o adormecimiento inusual en sus brazos o piernas.  · Presenta náuseas o vómitos.  · Presenta dolor abdominal.  · Se siente desfalleciente.  Document Released: 11/23/2005 Document Revised: 05/24/2012  Exit[employer]® Patient Information ©2014 [employer].

## 2014-03-30 NOTE — ED Notes (Signed)
Discharge instructions reviewed with pt and pt's son Caprice RenshawFranscico.

## 2014-03-30 NOTE — ED Notes (Signed)
Pt not answering when name is called

## 2014-04-02 NOTE — ED Provider Notes (Signed)
Medical screening examination/treatment/procedure(s) were performed by non-physician practitioner and as supervising physician I was immediately available for consultation/collaboration.   EKG Interpretation None        Audree CamelScott T Sherrian Nunnelley, MD 04/02/14 [phone]

## 2014-05-27 ENCOUNTER — Encounter (HOSPITAL_COMMUNITY): Payer: Self-pay | Admitting: Emergency Medicine

## 2014-05-27 ENCOUNTER — Inpatient Hospital Stay (HOSPITAL_COMMUNITY)
Admission: EM | Admit: 2014-05-27 | Discharge: 2014-05-29 | DRG: 684 | Disposition: A | Discharge status: Home / Self Care | Payer: Medicaid Other | Attending: Internal Medicine | Admitting: Internal Medicine

## 2014-05-27 ENCOUNTER — Emergency Department (HOSPITAL_COMMUNITY): Payer: Medicaid Other

## 2014-05-27 DIAGNOSIS — E1122 Type 2 diabetes mellitus with diabetic chronic kidney disease: Secondary | ICD-10-CM | POA: Diagnosis present

## 2014-05-27 DIAGNOSIS — R42 Dizziness and giddiness: Secondary | ICD-10-CM

## 2014-05-27 DIAGNOSIS — M1610 Unilateral primary osteoarthritis, unspecified hip: Secondary | ICD-10-CM | POA: Diagnosis present

## 2014-05-27 DIAGNOSIS — R739 Hyperglycemia, unspecified: Secondary | ICD-10-CM

## 2014-05-27 DIAGNOSIS — Z8249 Family history of ischemic heart disease and other diseases of the circulatory system: Secondary | ICD-10-CM

## 2014-05-27 DIAGNOSIS — J069 Acute upper respiratory infection, unspecified: Secondary | ICD-10-CM

## 2014-05-27 DIAGNOSIS — N183 Chronic kidney disease, stage 3 unspecified: Secondary | ICD-10-CM

## 2014-05-27 DIAGNOSIS — E860 Dehydration: Secondary | ICD-10-CM | POA: Diagnosis present

## 2014-05-27 DIAGNOSIS — I951 Orthostatic hypotension: Secondary | ICD-10-CM | POA: Diagnosis present

## 2014-05-27 DIAGNOSIS — IMO0001 Reserved for inherently not codable concepts without codable children: Secondary | ICD-10-CM | POA: Diagnosis present

## 2014-05-27 DIAGNOSIS — Z833 Family history of diabetes mellitus: Secondary | ICD-10-CM

## 2014-05-27 DIAGNOSIS — R7309 Other abnormal glucose: Secondary | ICD-10-CM

## 2014-05-27 DIAGNOSIS — N179 Acute kidney failure, unspecified: Principal | ICD-10-CM | POA: Diagnosis present

## 2014-05-27 DIAGNOSIS — M545 Low back pain, unspecified: Secondary | ICD-10-CM

## 2014-05-27 DIAGNOSIS — N189 Chronic kidney disease, unspecified: Secondary | ICD-10-CM

## 2014-05-27 DIAGNOSIS — I129 Hypertensive chronic kidney disease with stage 1 through stage 4 chronic kidney disease, or unspecified chronic kidney disease: Secondary | ICD-10-CM | POA: Diagnosis present

## 2014-05-27 DIAGNOSIS — E1142 Type 2 diabetes mellitus with diabetic polyneuropathy: Secondary | ICD-10-CM

## 2014-05-27 DIAGNOSIS — E119 Type 2 diabetes mellitus without complications: Secondary | ICD-10-CM

## 2014-05-27 DIAGNOSIS — E785 Hyperlipidemia, unspecified: Secondary | ICD-10-CM | POA: Diagnosis present

## 2014-05-27 DIAGNOSIS — I10 Essential (primary) hypertension: Secondary | ICD-10-CM

## 2014-05-27 DIAGNOSIS — Z794 Long term (current) use of insulin: Secondary | ICD-10-CM

## 2014-05-27 DIAGNOSIS — Z79899 Other long term (current) drug therapy: Secondary | ICD-10-CM

## 2014-05-27 DIAGNOSIS — E1165 Type 2 diabetes mellitus with hyperglycemia: Secondary | ICD-10-CM

## 2014-05-27 LAB — URINALYSIS, ROUTINE W REFLEX MICROSCOPIC
Bilirubin Urine: NEGATIVE
Glucose, UA: >1000 mg/dL — AB
Ketones, ur: NEGATIVE mg/dL
Leukocytes, UA: NEGATIVE
NITRITE: NEGATIVE
PROTEIN: 100 mg/dL — AB
Specific Gravity, Urine: 1.026 (ref 1.005–1.030)
UROBILINOGEN UA: 0.2 mg/dL (ref 0.0–1.0)
pH: 5 (ref 5.0–8.0)

## 2014-05-27 LAB — BASIC METABOLIC PANEL
BUN: 43 mg/dL — ABNORMAL HIGH (ref 6–23)
BUN: 37 mg/dL — AB (ref 6–23)
CALCIUM: 8.9 mg/dL (ref 8.4–10.5)
CHLORIDE: 89 meq/L — AB (ref 96–112)
CHLORIDE: 96 meq/L (ref 96–112)
CO2: 22 meq/L (ref 19–32)
CO2: 27 mEq/L (ref 19–32)
Calcium: 8.7 mg/dL (ref 8.4–10.5)
Creatinine, Ser: 2.07 mg/dL — ABNORMAL HIGH (ref 0.50–1.35)
Creatinine, Ser: 1.8 mg/dL — ABNORMAL HIGH (ref 0.50–1.35)
GFR calc Af Amer: 42 mL/min — ABNORMAL LOW (ref >90)
GFR calc non Af Amer: 36 mL/min — ABNORMAL LOW (ref >90)
GFR calc non Af Amer: 43 mL/min — ABNORMAL LOW (ref >90)
GFR, EST AFRICAN AMERICAN: 50 mL/min — AB (ref >90)
GLUCOSE: 636 mg/dL — AB (ref 70–99)
Glucose, Bld: 300 mg/dL — ABNORMAL HIGH (ref 70–99)
POTASSIUM: 3.4 meq/L — AB (ref 3.7–5.3)
Potassium: 3.8 mEq/L (ref 3.7–5.3)
SODIUM: 131 meq/L — AB (ref 137–147)
SODIUM: 136 meq/L — AB (ref 137–147)

## 2014-05-27 LAB — URINE MICROSCOPIC-ADD ON

## 2014-05-27 LAB — CBC WITH DIFFERENTIAL/PLATELET
BASOS ABS: 0.1 10*3/uL (ref 0.0–0.1)
Basophils Relative: 0 % (ref 0–1)
EOS PCT: 2 % (ref 0–5)
Eosinophils Absolute: 0.3 10*3/uL (ref 0.0–0.7)
HCT: 36.9 % — ABNORMAL LOW (ref 39.0–52.0)
Hemoglobin: 13.3 g/dL (ref 13.0–17.0)
LYMPHS ABS: 3.6 10*3/uL (ref 0.7–4.0)
LYMPHS PCT: 29 % (ref 12–46)
MCH: 30 pg (ref 26.0–34.0)
MCHC: 36 g/dL (ref 30.0–36.0)
MCV: 83.3 fL (ref 78.0–100.0)
Monocytes Absolute: 0.7 10*3/uL (ref 0.1–1.0)
Monocytes Relative: 6 % (ref 3–12)
NEUTROS ABS: 7.7 10*3/uL (ref 1.7–7.7)
Neutrophils Relative %: 63 % (ref 43–77)
PLATELETS: 268 10*3/uL (ref 150–400)
RBC: 4.43 MIL/uL (ref 4.22–5.81)
RDW: 12 % (ref 11.5–15.5)
WBC: 12.3 10*3/uL — AB (ref 4.0–10.5)

## 2014-05-27 LAB — CBG MONITORING, ED
GLUCOSE-CAPILLARY: 389 mg/dL — AB (ref 70–99)
Glucose-Capillary: 479 mg/dL — ABNORMAL HIGH (ref 70–99)
Glucose-Capillary: 293 mg/dL — ABNORMAL HIGH (ref 70–99)
Glucose-Capillary: 258 mg/dL — ABNORMAL HIGH (ref 70–99)

## 2014-05-27 LAB — I-STAT ARTERIAL BLOOD GAS, ED
Acid-Base Excess: 3 mmol/L — ABNORMAL HIGH (ref 0.0–2.0)
BICARBONATE: 28 meq/L — AB (ref 20.0–24.0)
O2 Saturation: 94 %
PH ART: 7.415 (ref 7.350–7.450)
PO2 ART: 69 mmHg — AB (ref 80.0–100.0)
TCO2: 29 mmol/L (ref 0–100)
pCO2 arterial: 43.6 mmHg (ref 35.0–45.0)

## 2014-05-27 LAB — I-STAT TROPONIN, ED: Troponin i, poc: 0.05 ng/mL (ref 0.00–0.08)

## 2014-05-27 MED ORDER — SODIUM CHLORIDE 0.9 % IV BOLUS (SEPSIS)
1000.0000 mL | Freq: Once | INTRAVENOUS | Status: AC
  Administered 2014-05-27: 1000 mL via INTRAVENOUS

## 2014-05-27 MED ORDER — POTASSIUM CHLORIDE CRYS ER 20 MEQ PO TBCR
20.0000 meq | EXTENDED_RELEASE_TABLET | Freq: Once | ORAL | Status: AC
  Administered 2014-05-27: 20 meq via ORAL
  Filled 2014-05-27: qty 1

## 2014-05-27 MED ORDER — INSULIN ASPART 100 UNIT/ML ~~LOC~~ SOLN
0.0000 [IU] | Freq: Three times a day (TID) | SUBCUTANEOUS | Status: DC
  Administered 2014-05-28: 5 [IU] via SUBCUTANEOUS
  Administered 2014-05-28: 8 [IU] via SUBCUTANEOUS
  Administered 2014-05-29: 15 [IU] via SUBCUTANEOUS
  Administered 2014-05-29: 5 [IU] via SUBCUTANEOUS

## 2014-05-27 MED ORDER — SODIUM CHLORIDE 0.9 % IV SOLN
INTRAVENOUS | Status: DC
  Administered 2014-05-27: 5.4 [IU]/h via INTRAVENOUS
  Filled 2014-05-27: qty 1

## 2014-05-27 NOTE — ED Provider Notes (Signed)
CSN: [id number]     Arrival date & time 05/27/14  1333 History   First MD Initiated Contact with Patient 05/27/14 1525     Chief Complaint  Patient presents with  . Back Pain     (Consider location/radiation/quality/duration/timing/severity/associated sxs/prior Treatment) HPI  Patient presents with left lower back pain that has been ongoing x 2 months, occasionally radiated up to his left upper back and around this his chest.  His chest only hurts when his pain is at its worst.  It is exacerbated with sitting. States he feels bad and doesn't feel like himself, feels somewhat dizzy.  Also is having extreme thirst, dry mouth, urinary frequency.  Takes PO diabetes medications and insulin but does not check blood sugar.   Denies fevers, recent illness, abdominal pain, weakness or numbness of the extremities (does note chronic bilateral foot numbness, dx diabetic neuropathy).  Was seen in ED for this in April, followed up with PCP Lerry Linerwight Williams, then had another appointment at "the hospital" but doesn't know who he saw or what was done.     Past Medical History  Diagnosis Date  . Diabetes mellitus without complication   . Hypertension    Past Surgical History  Procedure Laterality Date  . Appendectomy     Family History  Problem Relation Age of Onset  . Hypertension Mother   . Diabetes Mother   . Diabetes Father   . Diabetes Sister   . Diabetes Brother   . Diabetes Maternal Aunt   . Diabetes Brother   . Diabetes Brother    History  Substance Use Topics  . Smoking status: Never Smoker   . Smokeless tobacco: Never Used  . Alcohol Use: Yes    Review of Systems  All other systems reviewed and are negative.     Allergies  Review of patient's allergies indicates no known allergies.  Home Medications   Prior to Admission medications   Medication Sig Start Date End Date Taking? Authorizing Brithney Bensen  amLODipine (NORVASC) 10 MG tablet Take 1 tablet (10 mg total) by mouth  daily. 11/04/13   Tiffany Irine SealG Greene, PA-C  atorvastatin (LIPITOR) 20 MG tablet Take 20 mg by mouth daily.    Historical Thorin Starner, MD  cyclobenzaprine (FLEXERIL) 10 MG tablet Take 1 tablet (10 mg total) by mouth 2 (two) times daily as needed for muscle spasms. 03/30/14   Shari A Upstill, PA-C  glipiZIDE (GLUCOTROL) 10 MG tablet Take 10 mg by mouth daily.    Historical Lynnda Wiersma, MD  HYDROcodone-acetaminophen (NORCO/VICODIN) 5-325 MG per tablet Take 1-2 tablets by mouth every 4 (four) hours as needed. 03/30/14   Shari A Upstill, PA-C  insulin glargine (LANTUS) 100 UNIT/ML injection Inject 28 Units into the skin at bedtime.    Historical Tommy Minichiello, MD  irbesartan-hydrochlorothiazide (AVALIDE) 300-12.5 MG per tablet Take 1 tablet by mouth daily. 02/22/14   Lennette Biharihomas A Kelly, MD  metFORMIN (GLUCOPHAGE) 850 MG tablet Take 850 mg by mouth 3 (three) times daily.    Historical Lanyia Jewel, MD  metoprolol succinate (TOPROL XL) 50 MG 24 hr tablet Take 1 tablet (50 mg total) by mouth daily. 11/08/13   Nada BoozerLaura Ingold, NP  naproxen (NAPROSYN) 500 MG tablet Take 500 mg by mouth 2 (two) times daily with a meal.    Historical Delsie Amador, MD   BP 170/84  Pulse 84  Temp(Src) 98.1 F (36.7 C) (Oral)  Resp 11  SpO2 98% Physical Exam  Nursing note and vitals reviewed. Constitutional: He appears well-developed and  well-nourished. No distress.  HENT:  Head: Normocephalic and atraumatic.  Mouth/Throat: Mucous membranes are dry.  Neck: Neck supple.  Cardiovascular: Normal rate and regular rhythm.   Pulmonary/Chest: Effort normal and breath sounds normal. No respiratory distress. He has no wheezes. He has no rales.  Abdominal: Soft. He exhibits no distension and no mass. There is tenderness in the left lower quadrant. There is no rebound and no guarding.  Musculoskeletal: Normal range of motion.       Arms: Spine nontender, no crepitus, or stepoffs. Lower extremities:  Strength 5/5, sensation intact, distal pulses intact.       Neurological: He is alert. He exhibits normal muscle tone.  Skin: He is not diaphoretic.  Psychiatric: He has a normal mood and affect. His behavior is normal.    ED Course  Procedures (including critical care time) Labs Review Labs Reviewed  CBC WITH DIFFERENTIAL - Abnormal; Notable for the following:    WBC 12.3 (*)    HCT 36.9 (*)    All other components within normal limits  BASIC METABOLIC PANEL - Abnormal; Notable for the following:    Sodium 131 (*)    Chloride 89 (*)    Glucose, Bld 636 (*)    BUN 43 (*)    Creatinine, Ser 2.07 (*)    GFR calc non Af Amer 36 (*)    GFR calc Af Amer 42 (*)    All other components within normal limits  BASIC METABOLIC PANEL - Abnormal; Notable for the following:    Sodium 136 (*)    Potassium 3.4 (*)    Glucose, Bld 300 (*)    BUN 37 (*)    Creatinine, Ser 1.80 (*)    GFR calc non Af Amer 43 (*)    GFR calc Af Amer 50 (*)    All other components within normal limits  URINALYSIS, ROUTINE W REFLEX MICROSCOPIC - Abnormal; Notable for the following:    Glucose, UA >1000 (*)    Hgb urine dipstick SMALL (*)    Protein, ur 100 (*)    All other components within normal limits  CBG MONITORING, ED - Abnormal; Notable for the following:    Glucose-Capillary >600 (*)    All other components within normal limits  I-STAT ARTERIAL BLOOD GAS, ED - Abnormal; Notable for the following:    pO2, Arterial 69.0 (*)    Bicarbonate 28.0 (*)    Acid-Base Excess 3.0 (*)    All other components within normal limits  CBG MONITORING, ED - Abnormal; Notable for the following:    Glucose-Capillary 479 (*)    All other components within normal limits  CBG MONITORING, ED - Abnormal; Notable for the following:    Glucose-Capillary 389 (*)    All other components within normal limits  CBG MONITORING, ED - Abnormal; Notable for the following:    Glucose-Capillary 293 (*)    All other components within normal limits  CBG MONITORING, ED - Abnormal; Notable for  the following:    Glucose-Capillary 258 (*)    All other components within normal limits  URINE MICROSCOPIC-ADD ON  I-STAT TROPOININ, ED   Anion gap is 14  Imaging Review Dg Chest 2 View  05/27/2014   CLINICAL DATA:  Left chest pain and SOB  EXAM: CHEST  2 VIEW  COMPARISON:  11/04/2013  FINDINGS: The heart size and mediastinal contours are within normal limits. Both lungs are clear. The visualized skeletal structures are unremarkable.  IMPRESSION: No active  cardiopulmonary disease.   Electronically Signed   By: Elige Ko   On: 05/27/2014 17:40   Dg Hip Complete Left  05/27/2014   CLINICAL DATA:  Left hip pain  EXAM: LEFT HIP - COMPLETE 2+ VIEW  COMPARISON:  None.  FINDINGS: There is no evidence of fracture, subluxation or dislocation.  Very mild degenerative changes within the hips noted.  No focal bony lesions are present.  IMPRESSION: No evidence of acute abnormality.  Very mild degenerative changes within both hips.   Electronically Signed   By: Laveda Abbe M.D.   On: 05/27/2014 20:06     EKG Interpretation None      9:29 PM Discussed pt with Dr Rosalia Hammers  MDM   Final diagnoses:  Hyperglycemia  Acute on chronic renal failure  Orthostatic dizziness    Patient with DM, HTN p/w two complaints.  One issue is his left lower back pain that is occasionally painful and occasionally numb ("asleep").  There are no red flags for back pain.  He also c/o not feeling well, dry mouth, thirst, general fatigue.  He takes diabetes medications but does not check his blood sugars.  Blood glucose found to be 636, also with worsening renal function.  He was improving with IVF and insulin but after 2 L IVF stood up to give urine specimen and became very dizzy - orthostatic vital signs perform at that time show that patient is orthostatic.  Admitted to [facility] Dr Sunnie Nielsen for acute on chronic kidney dysfunction, uncontrolled diabetes, orthostatic dizziness.      Trixie Dredge, PA-C 05/27/14 2311

## 2014-05-27 NOTE — ED Notes (Signed)
Per emily, PA, who translated for patient, pt c/o left hip pain, travels up to left shoulder blade. Pain also in chest. Describes it as sharp. When the pain in his hip gets bad, he gets chest pain and shortness of breathe. For two months, hip pain is constant.

## 2014-05-27 NOTE — ED Notes (Signed)
CBG 258 

## 2014-05-27 NOTE — ED Notes (Signed)
Pt stood up and became very dizzy. Pt sat back down in bed and using urinal

## 2014-05-27 NOTE — ED Notes (Signed)
CBG 293 

## 2014-05-27 NOTE — H&P (Signed)
[facility] History and Physical  Phillip Murphy ZOX:[id number] DOB: 09/06/1966 DOA: 05/27/2014  Referring physician: ED physician.  PCP: PROVIDER NOT IN SYSTEM , Dr Mayford KnifeWilliams.   Chief Complaint: Dizziness.   HPI: Phillip SarMoises Beason is a 48 y.o. male with PMH significant for Diabetes, CKD last cr per records at 1.4 to 1.7, who presents to ED complaining of dizziness for last 3 days. He also relates left hip pain, and upper back pain for last few months. He just finished course of prednisone 5 days ago. He denies cough, chest pain, dyspnea, abdominal pain, diarrhea, dysuria.    Review of Systems:  Negative, except as per HPI.   Past Medical History  Diagnosis Date  . Diabetes mellitus without complication   . Hypertension    Past Surgical History  Procedure Laterality Date  . Appendectomy     Social History:  reports that he has never smoked. He has never used smokeless tobacco. He reports that he drinks alcohol. His drug history is not on file.  No Known Allergies  Family History  Problem Relation Age of Onset  . Hypertension Mother   . Diabetes Mother   . Diabetes Father   . Diabetes Sister   . Diabetes Brother   . Diabetes Maternal Aunt   . Diabetes Brother   . Diabetes Brother      Prior to Admission medications   Medication Sig Start Date End Date Taking? Authorizing Provider  amLODipine (NORVASC) 10 MG tablet Take 1 tablet (10 mg total) by mouth daily. 11/04/13  Yes Tiffany Irine SealG Greene, PA-C  atorvastatin (LIPITOR) 20 MG tablet Take 20 mg by mouth daily.   Yes Historical Provider, MD  cyclobenzaprine (FLEXERIL) 10 MG tablet Take 1 tablet (10 mg total) by mouth 2 (two) times daily as needed for muscle spasms. 03/30/14  Yes Shari A Upstill, PA-C  glipiZIDE (GLUCOTROL) 10 MG tablet Take 10 mg by mouth daily.   Yes Historical Provider, MD  insulin detemir (LEVEMIR) 100 UNIT/ML injection Inject 32 Units into the skin every morning.   Yes Historical Provider, MD  insulin glargine  (LANTUS) 100 UNIT/ML injection Inject 9 Units into the skin at bedtime.    Yes Historical Provider, MD  irbesartan-hydrochlorothiazide (AVALIDE) 300-12.5 MG per tablet Take 1 tablet by mouth daily. 02/22/14  Yes Lennette Biharihomas A Kelly, MD  metFORMIN (GLUCOPHAGE) 850 MG tablet Take 850 mg by mouth 3 (three) times daily.   Yes Historical Provider, MD  metoprolol succinate (TOPROL XL) 50 MG 24 hr tablet Take 1 tablet (50 mg total) by mouth daily. 11/08/13  Yes Nada BoozerLaura Ingold, NP   Physical Exam: Filed Vitals:   05/27/14 2335  BP:   Pulse:   Temp: 98.4 F (36.9 C)  Resp:     BP 168/85  Pulse 80  Temp(Src) 98.4 F (36.9 C) (Oral)  Resp 18  SpO2 97%  General:  Appears calm and comfortable Eyes: PERRL, normal lids, irises & conjunctiva ENT: grossly normal hearing, lips & tongue Neck: no LAD, masses or thyromegaly Cardiovascular: RRR, no m/r/g. No LE edema. Respiratory: CTA bilaterally, no w/r/r. Normal respiratory effort. Abdomen: soft, ntnd Skin: no rash or induration seen on limited exam Musculoskeletal: grossly normal tone BUE/BLE Psychiatric: grossly normal mood and affect, speech fluent and appropriate Neurologic: grossly non-focal.          Labs on Admission:  Basic Metabolic Panel:  Recent Labs Lab 05/27/14 1420 05/27/14 2034  NA 131* 136*  K 3.8 3.4*  CL 89* 96  CO2 22 27  GLUCOSE 636* 300*  BUN 43* 37*  CREATININE 2.07* 1.80*  CALCIUM 8.9 8.7   Liver Function Tests: No results found for this basename: AST, ALT, ALKPHOS, BILITOT, PROT, ALBUMIN,  in the last 168 hours No results found for this basename: LIPASE, AMYLASE,  in the last 168 hours No results found for this basename: AMMONIA,  in the last 168 hours CBC:  Recent Labs Lab 05/27/14 1420  WBC 12.3*  NEUTROABS 7.7  HGB 13.3  HCT 36.9*  MCV 83.3  PLT 268   Cardiac Enzymes: No results found for this basename: CKTOTAL, CKMB, CKMBINDEX, TROPONINI,  in the last 168 hours  BNP (last 3 results) No results  found for this basename: PROBNP,  in the last 8760 hours CBG:  Recent Labs Lab 05/27/14 1617 05/27/14 1730 05/27/14 1835 05/27/14 2002 05/27/14 2044  GLUCAP >600* 479* 389* 293* 258*    Radiological Exams on Admission: Dg Chest 2 View  05/27/2014   CLINICAL DATA:  Left chest pain and SOB  EXAM: CHEST  2 VIEW  COMPARISON:  11/04/2013  FINDINGS: The heart size and mediastinal contours are within normal limits. Both lungs are clear. The visualized skeletal structures are unremarkable.  IMPRESSION: No active cardiopulmonary disease.   Electronically Signed   By: Elige KoHetal  Patel   On: 05/27/2014 17:40   Dg Hip Complete Left  05/27/2014   CLINICAL DATA:  Left hip pain  EXAM: LEFT HIP - COMPLETE 2+ VIEW  COMPARISON:  None.  FINDINGS: There is no evidence of fracture, subluxation or dislocation.  Very mild degenerative changes within the hips noted.  No focal bony lesions are present.  IMPRESSION: No evidence of acute abnormality.  Very mild degenerative changes within both hips.   Electronically Signed   By: Laveda AbbeJeff  Hu M.D.   On: 05/27/2014 20:06    EKG: Independently reviewed. Sinus tachycardia.   Assessment/Plan Principal Problem:   Acute on chronic renal failure Active Problems:   Diabetes mellitus with stage 3 chronic kidney disease   Morbid obesity   1-Diabetes, Uncontrolled. Patient presents with blood sugar in the 600 range. Worsening diabetes maybe in the setting of recent steroids use. No evidence of infection. Will transition from insulin Gtt due levemir and SSI. Patient can not take metformin due to renal failure. He will need to be discharge off metformin. Diabetes educator consulted.   2-Acute on chronic renal failure; in setting of uncontrolled diabetes, dehydration. Continue with IV fluids, check urine sodium and Cr. Strict I and O.   3-hip pain; arthritis. X ray Very mild degenerative changes within both hips.  4-Dizziness in setting of orthostatic hypotension. Probably  related to dehydration. Continue with IV fluids. Repeat orthostatic in am. Cycle cardiac enzymes. neuro exam non focal.     Code Status: full code.  Family Communication: Care discussed with wife who was at bedside.  Disposition Plan: expect 2 to 3 days.   Time spent: 75 minutes.   Hartley Barefootegalado, Belkys A [facility] Pager [phone]  **Disclaimer: This note may have been dictated with voice recognition software. Similar sounding words can inadvertently be transcribed and this note may contain transcription errors which may not have been corrected upon publication of note.**

## 2014-05-27 NOTE — ED Notes (Addendum)
Pt presents to department for evaluation of back pain. Ongoing for several months. Pain increases with movement. Pt is alert and oriented x4. NAD. Also states dizziness and chest pain last night, denies CP at the time. Respirations unlabored. Pt is alert, does not speak english, interpreter phones used for triage assessment.

## 2014-05-27 NOTE — ED Notes (Signed)
CBG 636

## 2014-05-27 NOTE — ED Notes (Signed)
CBG was 389

## 2014-05-27 NOTE — ED Notes (Signed)
Pt returned from xray

## 2014-05-27 NOTE — ED Notes (Signed)
Pt returned from xray. CBG 479

## 2014-05-28 DIAGNOSIS — R42 Dizziness and giddiness: Secondary | ICD-10-CM

## 2014-05-28 DIAGNOSIS — I10 Essential (primary) hypertension: Secondary | ICD-10-CM

## 2014-05-28 DIAGNOSIS — N183 Chronic kidney disease, stage 3 unspecified: Secondary | ICD-10-CM

## 2014-05-28 DIAGNOSIS — E1129 Type 2 diabetes mellitus with other diabetic kidney complication: Secondary | ICD-10-CM

## 2014-05-28 LAB — BASIC METABOLIC PANEL
BUN: 29 mg/dL — ABNORMAL HIGH (ref 6–23)
CHLORIDE: 101 meq/L (ref 96–112)
CO2: 30 mEq/L (ref 19–32)
CREATININE: 1.71 mg/dL — AB (ref 0.50–1.35)
Calcium: 8.8 mg/dL (ref 8.4–10.5)
GFR calc non Af Amer: 46 mL/min — ABNORMAL LOW (ref >90)
GFR, EST AFRICAN AMERICAN: 53 mL/min — AB (ref >90)
Glucose, Bld: 257 mg/dL — ABNORMAL HIGH (ref 70–99)
POTASSIUM: 3.6 meq/L — AB (ref 3.7–5.3)
SODIUM: 140 meq/L (ref 137–147)

## 2014-05-28 LAB — SODIUM, URINE, RANDOM: SODIUM UR: 48 meq/L

## 2014-05-28 LAB — GLUCOSE, CAPILLARY
GLUCOSE-CAPILLARY: 111 mg/dL — AB (ref 70–99)
GLUCOSE-CAPILLARY: 253 mg/dL — AB (ref 70–99)
Glucose-Capillary: 233 mg/dL — ABNORMAL HIGH (ref 70–99)
Glucose-Capillary: 240 mg/dL — ABNORMAL HIGH (ref 70–99)
Glucose-Capillary: 228 mg/dL — ABNORMAL HIGH (ref 70–99)

## 2014-05-28 LAB — TROPONIN I
Troponin I: <0.3 ng/mL (ref <0.30)
Troponin I: <0.3 ng/mL (ref <0.30)
Troponin I: <0.3 ng/mL (ref <0.30)

## 2014-05-28 LAB — HEMOGLOBIN A1C
Hgb A1c MFr Bld: 14.2 % — ABNORMAL HIGH (ref <5.7)
Mean Plasma Glucose: 361 mg/dL — ABNORMAL HIGH (ref <117)

## 2014-05-28 LAB — CREATININE, URINE, RANDOM: Creatinine, Urine: 35.47 mg/dL

## 2014-05-28 MED ORDER — ATORVASTATIN CALCIUM 20 MG PO TABS
20.0000 mg | ORAL_TABLET | Freq: Every day | ORAL | Status: DC
  Administered 2014-05-28 – 2014-05-29 (×2): 20 mg via ORAL
  Filled 2014-05-28 (×2): qty 1

## 2014-05-28 MED ORDER — SODIUM CHLORIDE 0.9 % IJ SOLN
3.0000 mL | Freq: Two times a day (BID) | INTRAMUSCULAR | Status: DC
  Administered 2014-05-28 (×2): 3 mL via INTRAVENOUS

## 2014-05-28 MED ORDER — ACETAMINOPHEN 650 MG RE SUPP: 650.0000 mg | Freq: Four times a day (QID) | RECTAL | Status: DC | PRN

## 2014-05-28 MED ORDER — INSULIN DETEMIR 100 UNIT/ML ~~LOC~~ SOLN
45.0000 [IU] | Freq: Every day | SUBCUTANEOUS | Status: DC
  Administered 2014-05-28: 45 [IU] via SUBCUTANEOUS
  Filled 2014-05-28 (×2): qty 0.45

## 2014-05-28 MED ORDER — HYDRALAZINE HCL 20 MG/ML IJ SOLN: 10.0000 mg | Freq: Three times a day (TID) | INTRAMUSCULAR | Status: DC | PRN

## 2014-05-28 MED ORDER — SODIUM CHLORIDE 0.9 % IV SOLN
INTRAVENOUS | Status: AC
  Administered 2014-05-28 (×2): via INTRAVENOUS

## 2014-05-28 MED ORDER — HYDROCODONE-ACETAMINOPHEN 5-325 MG PO TABS
1.0000 | ORAL_TABLET | ORAL | Status: DC | PRN
  Administered 2014-05-29: 1 via ORAL
  Filled 2014-05-28: qty 1

## 2014-05-28 MED ORDER — AMLODIPINE BESYLATE 10 MG PO TABS
10.0000 mg | ORAL_TABLET | Freq: Every day | ORAL | Status: DC
  Administered 2014-05-28: 10 mg via ORAL
  Filled 2014-05-28: qty 1

## 2014-05-28 MED ORDER — ONDANSETRON HCL 4 MG/2ML IJ SOLN: 4.0000 mg | Freq: Four times a day (QID) | INTRAMUSCULAR | Status: DC | PRN

## 2014-05-28 MED ORDER — GLIPIZIDE 10 MG PO TABS
10.0000 mg | ORAL_TABLET | Freq: Every day | ORAL | Status: DC
  Administered 2014-05-28 – 2014-05-29 (×2): 10 mg via ORAL
  Filled 2014-05-28 (×2): qty 1

## 2014-05-28 MED ORDER — ACETAMINOPHEN 325 MG PO TABS: 650.0000 mg | ORAL_TABLET | Freq: Four times a day (QID) | ORAL | Status: DC | PRN

## 2014-05-28 MED ORDER — METOPROLOL SUCCINATE ER 50 MG PO TB24
50.0000 mg | ORAL_TABLET | Freq: Every day | ORAL | Status: DC
  Administered 2014-05-28 – 2014-05-29 (×2): 50 mg via ORAL
  Filled 2014-05-28 (×2): qty 1

## 2014-05-28 MED ORDER — CYCLOBENZAPRINE HCL 10 MG PO TABS: 10.0000 mg | ORAL_TABLET | Freq: Two times a day (BID) | ORAL | Status: DC | PRN

## 2014-05-28 MED ORDER — HEPARIN SODIUM (PORCINE) 5000 UNIT/ML IJ SOLN
5000.0000 [IU] | Freq: Three times a day (TID) | INTRAMUSCULAR | Status: DC
  Administered 2014-05-28 – 2014-05-29 (×4): 5000 [IU] via SUBCUTANEOUS
  Filled 2014-05-28 (×8): qty 1

## 2014-05-28 MED ORDER — ISOSORB DINITRATE-HYDRALAZINE 20-37.5 MG PO TABS
1.0000 | ORAL_TABLET | Freq: Two times a day (BID) | ORAL | Status: DC
  Administered 2014-05-28 – 2014-05-29 (×2): 1 via ORAL
  Filled 2014-05-28 (×3): qty 1

## 2014-05-28 MED ORDER — INSULIN DETEMIR 100 UNIT/ML ~~LOC~~ SOLN
32.0000 [IU] | Freq: Every day | SUBCUTANEOUS | Status: DC
  Administered 2014-05-28: 32 [IU] via SUBCUTANEOUS
  Filled 2014-05-28 (×3): qty 0.32

## 2014-05-28 MED ORDER — DOCUSATE SODIUM 100 MG PO CAPS
100.0000 mg | ORAL_CAPSULE | Freq: Two times a day (BID) | ORAL | Status: DC
  Administered 2014-05-28 – 2014-05-29 (×4): 100 mg via ORAL
  Filled 2014-05-28 (×5): qty 1

## 2014-05-28 MED ORDER — SODIUM CHLORIDE 0.9 % IV SOLN
INTRAVENOUS | Status: DC
  Administered 2014-05-28 (×2): via INTRAVENOUS

## 2014-05-28 MED ORDER — ONDANSETRON HCL 4 MG PO TABS: 4.0000 mg | ORAL_TABLET | Freq: Four times a day (QID) | ORAL | Status: DC | PRN

## 2014-05-28 NOTE — Progress Notes (Signed)
[facility] PROGRESS NOTE  Phillip Murphy ZOX:[id number] DOB: 12/29/1965 DOA: 05/27/2014 PCP: PROVIDER NOT IN SYSTEM  Assessment/Plan: 1-Diabetes, Uncontrolled: Patient presents with blood sugar in the 600 range. Worsening diabetes maybe in the setting of recent steroids use.  -A1C 14.2 -patient reprots poor compliance with diet -Patient can not take metformin due to renal failure. -appreciated diabetes coordinator assistance/intervention -will increase levemir to 45 units, continue SSI and glipizide  2-Acute on chronic renal failure; in setting of uncontrolled diabetes, dehydration.  -renal function back to baseline (Cr 1.7) -encourage to keep himself hydrated -will focus on better controlled of DM -continue holding nephrotoxic agents today -BMET in am  3-hip pain; arthritis. X ray Very mild degenerative changes within both hips.  -continue PRN flexeril and tylenol -will benefit of weight loss and comfortable shoes  4-Dizziness in setting of orthostatic hypotension. Probably related to dehydration.  -resolved with IVF's  5-HLD: continue lipitor  6-HTN: will continue metoprolol and will add bidil. -patietn reports intermittent episodes of pedal edema; most likely from norvasc. Will discontinue this medication -plan is to resume ibesartan priro to discharge if renal function remains stable.  Code Status: Full Family Communication: daughter at bedside Disposition Plan: home when medically stable   Consultants:  None   Procedures:  See below for x-ray reports   Antibiotics:  None   HPI/Subjective: Feeling a lot better. Reports no fever and denies CP or SOB. Still with mild pain on his left lower back and also left shoulder.  Objective: Filed Vitals:   05/28/14 1027  BP: 178/102  Pulse: 74  Temp: 97.5 F (36.4 C)  Resp: 17    Intake/Output Summary (Last 24 hours) at 05/28/14 1538 Last data filed at 05/28/14 1002  Gross per 24 hour  Intake 1983.75 ml   Output   2805 ml  Net -821.25 ml   Filed Weights   05/28/14 0045  Weight: 112.855 kg (248 lb 12.8 oz)    Exam:   General:  Feeling better, denies N/V and CP  Cardiovascular: S1 and S2, no rubs or gallops; no Le edema  Respiratory: CTA bilaterally  Abdomen: soft, NT, ND, positive BS  Musculoskeletal: no edema or cyanosis  Data Reviewed: Basic Metabolic Panel:  Recent Labs Lab 05/27/14 1420 05/27/14 2034 05/28/14 0807  NA 131* 136* 140  K 3.8 3.4* 3.6*  CL 89* 96 101  CO2 22 27 30   GLUCOSE 636* 300* 257*  BUN 43* 37* 29*  CREATININE 2.07* 1.80* 1.71*  CALCIUM 8.9 8.7 8.8   CBC:  Recent Labs Lab 05/27/14 1420  WBC 12.3*  NEUTROABS 7.7  HGB 13.3  HCT 36.9*  MCV 83.3  PLT 268   Cardiac Enzymes:  Recent Labs Lab 05/27/14 2351 05/28/14 0551 05/28/14 1153  TROPONINI <0.30 <0.30 <0.30   BNP (last 3 results) No results found for this basename: PROBNP,  in the last 8760 hours CBG:  Recent Labs Lab 05/27/14 2002 05/27/14 2044 05/28/14 0043 05/28/14 0732 05/28/14 1202  GLUCAP 293* 258* 253* 233* 240*    No results found for this or any previous visit (from the past 240 hour(s)).   Studies: Dg Chest 2 View  05/27/2014   CLINICAL DATA:  Left chest pain and SOB  EXAM: CHEST  2 VIEW  COMPARISON:  11/04/2013  FINDINGS: The heart size and mediastinal contours are within normal limits. Both lungs are clear. The visualized skeletal structures are unremarkable.  IMPRESSION: No active cardiopulmonary disease.   Electronically Signed   By:  Elige KoHetal  Patel   On: 05/27/2014 17:40   Dg Hip Complete Left  05/27/2014   CLINICAL DATA:  Left hip pain  EXAM: LEFT HIP - COMPLETE 2+ VIEW  COMPARISON:  None.  FINDINGS: There is no evidence of fracture, subluxation or dislocation.  Very mild degenerative changes within the hips noted.  No focal bony lesions are present.  IMPRESSION: No evidence of acute abnormality.  Very mild degenerative changes within both hips.    Electronically Signed   By: Laveda AbbeJeff  Hu M.D.   On: 05/27/2014 20:06    Scheduled Meds: . [COMPLETED] sodium chloride   Intravenous STAT  . atorvastatin  20 mg Oral Daily  . docusate sodium  100 mg Oral BID  . glipiZIDE  10 mg Oral Daily  . heparin  5,000 Units Subcutaneous 3 times per day  . insulin aspart  0-15 Units Subcutaneous TID WC  . insulin detemir  45 Units Subcutaneous QHS  . isosorbide-hydrALAZINE  1 tablet Oral BID  . metoprolol succinate  50 mg Oral Daily  . sodium chloride  3 mL Intravenous Q12H   Continuous Infusions: . sodium chloride 100 mL/hr at 05/28/14 1243    Principal Problem:   Acute on chronic renal failure Active Problems:   Diabetes mellitus with stage 3 chronic kidney disease   Morbid obesity    Time spent: >30 minutes    Vassie LollMadera, Carlos  [facility] Pager [phone]. If 7PM-7AM, please contact night-coverage at [email or web address], password [email or web address] 05/28/2014, 3:38 PM  LOS: 1 day

## 2014-05-28 NOTE — Progress Notes (Signed)
Inpatient Diabetes Program Recommendations  AACE/ADA: New Consensus Statement on Inpatient Glycemic Control (2013)  Target Ranges:  Prepandial:   less than 140 mg/dL      Peak postprandial:   less than 180 mg/dL (1-2 hours)      Critically ill patients:  140 - 180 mg/dL   Reason for Visit: MD Consult regarding diabetes education  Diabetes history: Type 2 diabetes Outpatient Diabetes medications: Levemir 30 units (32 units per med rec), Glucophage 850 mg tid, Glucotrol 10 mg daily- denies taking, Lantus 9 units at HS- denies taking  Current orders for Inpatient glycemic control: Glucotrol 10 mg daily, Levemir 32 units at HS  Note:  Had introduced self to patient.  Unable to effectively communicate with him due to language barrier.  Excused self and returned with an interpreter.    Confirmed his PCP as Dr. Mayford Kn[address] on [address].  Goes whenever he feels bad, needs of meds, and periodically as scheduled.   Home meds as listed above.  Note discrepancies from Med Rec.  Informed patient he would no longer be taking glucophage because of his kidneys.  Denies any problems obtaining medications through Medicaid.  Uses Levemir Flexpens.  Not accustomed to priming pen prior to each injection.  Pinches skin instead of properly spreading skin to give insulin with a pen.  Instructed regarding priming of pen and not pinching skin when giving insulin with a pen via interpreter.  Patient indicated understanding and very appreciative of information.  Last A1C available was 10.1.  Current A1C pending.  Patient knows his diabetes has been out of control and remembers his doctor saying his test result was "high".  Does not check CBG's at home.  Has a One Touch meter (meter and supplies not covered by Medicaid) but has no strips or lancets.  States a nurse at his doctor's office said she would check to see what meter is covered by Medicaid, but never heard anything else about it.   Patient indicates  willingness to go for outpatient diabetes education [facility].  [facility]  Called and scheduled appointment for August 5th at 3:30 PM.  Will have interpreter present.  Will also be placed on a waiting list in case an earlier appointment becomes available.    Patient agreeable to me ordering a general diabetes education booklet for him in [place].  At discharge patient needs: Rx for Accu Chek Aviva meter, strips, and lancets. (MD can enter [id number], search, click Database Lookup to access a Rx for meter, strips and lancets.  In comments could indicate "meter covered by Medicaid" or "Accu Chek Aviva".)  Encouragement to follow-up with PCP and [facility]  Clarification of discharge medications with use of interpreter  Thank you.  Patti S. Elsie Lincolnouth, RN, CNS, CDE Inpatient Diabetes Program, team pager [phone]

## 2014-05-28 NOTE — Progress Notes (Signed)
Paged Phillip Murphy in spanish interpreting for diabetic teaching.

## 2014-05-29 DIAGNOSIS — I129 Hypertensive chronic kidney disease with stage 1 through stage 4 chronic kidney disease, or unspecified chronic kidney disease: Secondary | ICD-10-CM

## 2014-05-29 DIAGNOSIS — E1142 Type 2 diabetes mellitus with diabetic polyneuropathy: Secondary | ICD-10-CM

## 2014-05-29 DIAGNOSIS — M545 Low back pain, unspecified: Secondary | ICD-10-CM

## 2014-05-29 DIAGNOSIS — E1149 Type 2 diabetes mellitus with other diabetic neurological complication: Secondary | ICD-10-CM

## 2014-05-29 LAB — BASIC METABOLIC PANEL
BUN: 25 mg/dL — ABNORMAL HIGH (ref 6–23)
CO2: 26 mEq/L (ref 19–32)
CREATININE: 1.54 mg/dL — AB (ref 0.50–1.35)
Calcium: 8.6 mg/dL (ref 8.4–10.5)
Chloride: 102 mEq/L (ref 96–112)
GFR calc non Af Amer: 52 mL/min — ABNORMAL LOW (ref >90)
GFR, EST AFRICAN AMERICAN: 60 mL/min — AB (ref >90)
Glucose, Bld: 231 mg/dL — ABNORMAL HIGH (ref 70–99)
POTASSIUM: 4.5 meq/L (ref 3.7–5.3)
Sodium: 139 mEq/L (ref 137–147)

## 2014-05-29 LAB — GLUCOSE, CAPILLARY
Glucose-Capillary: 204 mg/dL — ABNORMAL HIGH (ref 70–99)
Glucose-Capillary: 396 mg/dL — ABNORMAL HIGH (ref 70–99)

## 2014-05-29 MED ORDER — IRBESARTAN-HYDROCHLOROTHIAZIDE 300-12.5 MG PO TABS: 1.0000 | ORAL_TABLET | Freq: Every day | ORAL | Status: DC

## 2014-05-29 MED ORDER — ISOSORB DINITRATE-HYDRALAZINE 20-37.5 MG PO TABS: 1.0000 | ORAL_TABLET | Freq: Two times a day (BID) | ORAL | Status: DC

## 2014-05-29 MED ORDER — BLOOD GLUCOSE METER KIT: PACK | Status: DC

## 2014-05-29 MED ORDER — METOPROLOL SUCCINATE ER 50 MG PO TB24: 50.0000 mg | ORAL_TABLET | Freq: Every day | ORAL | Status: DC

## 2014-05-29 MED ORDER — ATORVASTATIN CALCIUM 20 MG PO TABS: 20.0000 mg | ORAL_TABLET | Freq: Every day | ORAL | Status: DC

## 2014-05-29 MED ORDER — HYDROCODONE-ACETAMINOPHEN 5-325 MG PO TABS: 1.0000 | ORAL_TABLET | Freq: Four times a day (QID) | ORAL | Status: DC | PRN

## 2014-05-29 MED ORDER — CYCLOBENZAPRINE HCL 10 MG PO TABS: 10.0000 mg | ORAL_TABLET | Freq: Every evening | ORAL | Status: DC | PRN

## 2014-05-29 MED ORDER — GLIPIZIDE 10 MG PO TABS: 10.0000 mg | ORAL_TABLET | Freq: Every day | ORAL | Status: DC

## 2014-05-29 MED ORDER — INSULIN DETEMIR 100 UNIT/ML FLEXPEN: 45.0000 [IU] | PEN_INJECTOR | Freq: Every day | SUBCUTANEOUS | Status: DC

## 2014-05-29 NOTE — Progress Notes (Addendum)
Inpatient Diabetes Program Recommendations  AACE/ADA: New Consensus Statement on Inpatient Glycemic Control (2013)  Target Ranges:  Prepandial:   less than 140 mg/dL      Peak postprandial:   less than 180 mg/dL (1-2 hours)      Critically ill patients:  140 - 180 mg/dL   Reason for Assessment:  Results for Anthony SarDIAZ, [place] (MRN [id number]) as of 05/29/2014 12:23  Ref. Range 05/28/2014 12:02 05/28/2014 16:51 05/28/2014 21:45 05/29/2014 07:41 05/29/2014 11:21  Glucose-Capillary Latest Range: 70-99 mg/dL 782240 (H) 956111 (H) 213228 (H) 204 (H) 396 (H)    Diabetes history: Type 2 diabetes  May consider adding Novolog meal coverage 5 units tid with meals-Hold if patient eats less than 50%.  Thanks, Beryl MeagerJenny Simpson, RN, BC-ADM Inpatient Diabetes Coordinator Pager [phone]

## 2014-05-29 NOTE — Discharge Summary (Signed)
Physician Discharge Summary  Phillip Murphy ALP:[id number] DOB: [birth year] DOA: 05/27/2014  PCP: PROVIDER NOT IN SYSTEM  Admit date: 05/27/2014 Discharge date: 05/29/2014  Time spent: >30 minutes  Recommendations for Outpatient Follow-up:  BMET to follow electrolytes and renal function Reassess BP and adjust medications as needed Close follow up to CBG's and diabetes; further adjustment to hypoglycemic regimen as needed  Discharge Diagnoses:  Principal Problem:   Acute on chronic renal failure Active Problems:   Diabetes mellitus with stage 3 chronic kidney disease   Morbid obesity HTN HLD  Discharge Condition: stable and improved.   Diet recommendation: low sodium and low carb diet  Filed Weights   05/28/14 0045 05/28/14 2148  Weight: 112.855 kg (248 lb 12.8 oz) 112.946 kg (249 lb)    History of present illness:  48 y.o. male with PMH significant for Diabetes, CKD last cr per records at 1.4 to 1.7, who presents to ED complaining of dizziness for last 3 days. He also relates left hip pain, and upper back pain for last few months. He just finished course of prednisone 5 days ago. He denies cough, chest pain, dyspnea, abdominal pain, diarrhea, dysuria.    Hospital Course:  1-Diabetes, Uncontrolled: Patient presents with blood sugar in the 600 range. Worsening diabetes maybe in the setting of recent steroids use.  -A1C 14.2  -patient reprots poor compliance with diet  -Patient can not take metformin anymore due to degree of renal failure.  -will increase levemir to 45 units, continue glipizide; will require close follow up and further titration of insulin (most likely will require levemir twice a day base on CBG's log) -patient with prescriptions for glucometer, lancets and strips; needs to check CBG's at least BID  2-Acute on chronic renal failure; in setting of uncontrolled diabetes, dehydration.  -renal function back to baseline (Cr 1.5)  -encourage to keep himself well hydrated   -will focus on better controlled of DM and BP -will resume ibesartan/HCTZ -will discontinue metformin   3-hip pain; arthritis. X ray Very mild degenerative changes within both hips.  -continue PRN flexeril and vicodin -will benefit of weight loss and comfortable shoes  -patient will follow with [facility] service as he reported intermittent episodes of sciatica  4-Dizziness in setting of orthostatic hypotension. Probably related to dehydration.  -resolved with IVF's   5-HLD: continue lipitor   6-HTN: will continue metoprolol, ibesartan/HCTZ and will add bidil.  -patietn reports intermittent episodes of pedal edema; most likely from norvasc. Will discontinue this medication    Procedures:  See below for x-ray reports   Consultations:  Diabetes coordinator   Discharge Exam: Filed Vitals:   05/29/14 0912  BP: 153/79  Pulse: 72  Temp: 98.7 F (37.1 C)  Resp: 18   General: Feeling great, denies N/V and CP ; no SOB Cardiovascular: S1 and S2, no rubs or gallops; no Le edema  Respiratory: CTA bilaterally  Abdomen: soft, NT, ND, positive BS  Musculoskeletal: no edema or cyanosis   Discharge Instructions You were cared for by a hospitalist during your hospital stay. If you have any questions about your discharge medications or the care you received while you were in the hospital after you are discharged, you can call the unit and asked to speak with the hospitalist on call if the hospitalist that took care of you is not available. Once you are discharged, your primary care physician will handle any further medical issues. Please note that NO REFILLS for any discharge medications will be  authorized once you are discharged, as it is imperative that you return to your primary care physician (or establish a relationship with a primary care physician if you do not have one) for your aftercare needs so that they can reassess your need for medications and monitor your lab  values.  Discharge Instructions   Ambulatory referral to Nutrition and Diabetic Education    Complete by:  As directed   Current A1C pending, but last available reading was 10.1.  Needs improved glucemic control.  Renal involvement.  Needs interpreter.  Wife speaks [place], cell phone # [phone].  Patient's cell phone # [phone].  Requests appointment for after 2 pm any day.  Appointment made for earliest available date August 5th at 3:30 pm.  Will be placed on a waiting list in case an appointment becomes available sooner.  Wife needs to attend with patient.     Diet - low sodium heart healthy    Complete by:  As directed      Discharge instructions    Complete by:  As directed   Follow low sodium diet and low carbohydrates diet Take medications as prescribed Follow with PCP in 10 days Check blood sugar twice a day Arrange follow up with orthopedic service            Medication List    STOP taking these medications       amLODipine 10 MG tablet  Commonly known as:  NORVASC     metFORMIN 850 MG tablet  Commonly known as:  GLUCOPHAGE      TAKE these medications       atorvastatin 20 MG tablet  Commonly known as:  LIPITOR  Take 1 tablet (20 mg total) by mouth daily.     blood glucose meter kit and supplies  Dispense based on patient and insurance preference. Use up to four times daily as directed. (FOR ICD-9 250.00, 250.01).     cyclobenzaprine 10 MG tablet  Commonly known as:  FLEXERIL  Take 1 tablet (10 mg total) by mouth at bedtime as needed for muscle spasms (lower back pain).     glipiZIDE 10 MG tablet  Commonly known as:  GLUCOTROL  Take 1 tablet (10 mg total) by mouth daily.     HYDROcodone-acetaminophen 5-325 MG per tablet  Commonly known as:  NORCO/VICODIN  Take 1 tablet by mouth every 6 (six) hours as needed for severe pain.     Insulin Detemir 100 UNIT/ML Pen  Commonly known as:  LEVEMIR FLEXPEN  Inject 45 Units into the skin daily at 10 pm.      irbesartan-hydrochlorothiazide 300-12.5 MG per tablet  Commonly known as:  AVALIDE  Take 1 tablet by mouth daily.     isosorbide-hydrALAZINE 20-37.5 MG per tablet  Commonly known as:  BIDIL  Take 1 tablet by mouth 2 (two) times daily.     metoprolol succinate 50 MG 24 hr tablet  Commonly known as:  TOPROL-XL  Take 1 tablet (50 mg total) by mouth daily.       No Known Allergies     Follow-up Information   Follow up with [place]. (call office to set up appointment to be seen for back pain)    Contact information:   [address] [place] [address] [phone]      The results of significant diagnostics from this hospitalization (including imaging, microbiology, ancillary and laboratory) are listed below for reference.    Significant Diagnostic Studies:  Dg Chest 2 View  05/27/2014   CLINICAL DATA:  Left chest pain and SOB  EXAM: CHEST  2 VIEW  COMPARISON:  11/04/2013  FINDINGS: The heart size and mediastinal contours are within normal limits. Both lungs are clear. The visualized skeletal structures are unremarkable.  IMPRESSION: No active cardiopulmonary disease.   Electronically Signed   By: Kathreen Devoid   On: 05/27/2014 17:40   Dg Hip Complete Left  05/27/2014   CLINICAL DATA:  Left hip pain  EXAM: LEFT HIP - COMPLETE 2+ VIEW  COMPARISON:  None.  FINDINGS: There is no evidence of fracture, subluxation or dislocation.  Very mild degenerative changes within the hips noted.  No focal bony lesions are present.  IMPRESSION: No evidence of acute abnormality.  Very mild degenerative changes within both hips.   Electronically Signed   By: Hassan Rowan M.D.   On: 05/27/2014 20:06   Labs: Basic Metabolic Panel:  Recent Labs Lab 05/27/14 1420 05/27/14 2034 05/28/14 0807 05/29/14 0420  NA 131* 136* 140 139  K 3.8 3.4* 3.6* 4.5  CL 89* 96 101 102  CO2 $Re'22 27 30 26  'WlW$ GLUCOSE 636* 300* 257* 231*  BUN 43* 37* 29* 25*  CREATININE 2.07* 1.80* 1.71*  1.54*  CALCIUM 8.9 8.7 8.8 8.6   CBC:  Recent Labs Lab 05/27/14 1420  WBC 12.3*  NEUTROABS 7.7  HGB 13.3  HCT 36.9*  MCV 83.3  PLT 268   Cardiac Enzymes:  Recent Labs Lab 05/27/14 2351 05/28/14 0551 05/28/14 1153  TROPONINI <0.30 <0.30 <0.30   CBG:  Recent Labs Lab 05/28/14 1202 05/28/14 1651 05/28/14 2145 05/29/14 0741 05/29/14 1121  GLUCAP 240* 111* 228* 204* 396*    Signed:  Barton Dubois  [facility] 05/29/2014, 3:36 PM

## 2014-05-29 NOTE — Progress Notes (Signed)
Pt discharge instructions and prescriptions given, pt verbalized understanding. VSS. Denies pain. Pleasant. Pt left floor ambulating accompanied by family.

## 2014-05-29 NOTE — Discharge Instructions (Addendum)
You have an appointment for outpatient diabetes education follow-up [facility], on July 11, 2014 at 3:30pm.  You have also been placed on a waiting list to move this appointment to an earlier date if there is a cancellation.  Please bring you wife with you to this appointment.  They will provide an interpreter.

## 2014-06-01 ENCOUNTER — Other Ambulatory Visit: Payer: Self-pay | Admitting: Sports Medicine

## 2014-06-01 DIAGNOSIS — M545 Low back pain: Secondary | ICD-10-CM

## 2014-06-01 DIAGNOSIS — M79605 Pain in left leg: Principal | ICD-10-CM

## 2014-06-01 NOTE — ED Provider Notes (Signed)
History/physical exam/procedure(s) were performed by non-physician practitioner and as supervising physician I was immediately available for consultation/collaboration. I have reviewed all notes and am in agreement with care and plan.   Allura Doepke S Aliene Tamura, MD 06/01/14 1052 

## 2014-06-12 ENCOUNTER — Ambulatory Visit (INDEPENDENT_AMBULATORY_CARE_PROVIDER_SITE_OTHER): Payer: Medicaid Other | Admitting: Family Medicine

## 2014-06-12 ENCOUNTER — Encounter: Payer: Self-pay | Admitting: Family Medicine

## 2014-06-12 VITALS — BP 127/77 | HR 67 | Temp 97.7°F | Wt 260.0 lb

## 2014-06-12 DIAGNOSIS — N183 Chronic kidney disease, stage 3 unspecified: Secondary | ICD-10-CM

## 2014-06-12 DIAGNOSIS — E1129 Type 2 diabetes mellitus with other diabetic kidney complication: Secondary | ICD-10-CM

## 2014-06-12 DIAGNOSIS — E1122 Type 2 diabetes mellitus with diabetic chronic kidney disease: Secondary | ICD-10-CM

## 2014-06-12 DIAGNOSIS — N189 Chronic kidney disease, unspecified: Secondary | ICD-10-CM | POA: Insufficient documentation

## 2014-06-12 DIAGNOSIS — M62830 Muscle spasm of back: Secondary | ICD-10-CM

## 2014-06-12 DIAGNOSIS — M5380 Other specified dorsopathies, site unspecified: Secondary | ICD-10-CM

## 2014-06-12 DIAGNOSIS — I129 Hypertensive chronic kidney disease with stage 1 through stage 4 chronic kidney disease, or unspecified chronic kidney disease: Secondary | ICD-10-CM

## 2014-06-12 MED ORDER — GLIPIZIDE 10 MG PO TABS: 10.0000 mg | ORAL_TABLET | Freq: Every day | ORAL | Status: DC

## 2014-06-12 MED ORDER — IRBESARTAN-HYDROCHLOROTHIAZIDE 300-12.5 MG PO TABS: 1.0000 | ORAL_TABLET | Freq: Every day | ORAL | Status: DC

## 2014-06-12 MED ORDER — INSULIN GLARGINE 100 UNIT/ML SOLOSTAR PEN: 30.0000 [IU] | PEN_INJECTOR | Freq: Every day | SUBCUTANEOUS | Status: DC

## 2014-06-12 NOTE — Progress Notes (Signed)
Subjective:     Patient ID: Phillip Murphy, male   DOB: 02/27/1966, 48 y.o.   MRN: [id number]  Patient presents for a new patient evaluation. Spanish interpreter present for visit.  HPI  BACK / SHOULDER PAIN: - Reports last night felt pain in back left shoulder blade, and since it radiated to anterior left chest wall, started without any exertion / activity, tried TUMs, fell asleep, felt better when woke up, since resolved - Currently complains of R-mid / upper back - shoulder blade pain, has not taken any meds for this due to concern about diabetes / kidney function, "told not to take Ibuprofen" - Denies fevers/chills, chest pain / tightness, SOB, HA, dizziness / lightheadedness, palpitations, abdominal pain, nausea / vomiting  CHRONIC DM, Type 2: Reports elevated sugars. Recent A1c 14.2 CBGs: Avg 300, Low 250, High 359. Checks CBGs 2x daily Meds: Levemir 45u PM, Glipizide 10mg  daily. Previously Metformin (discontinued after hospitalization) Reports good compliance. Tolerating well w/o side-effects Currently on ARB Lifestyle - poor dietary habits, no regular exercise - Admits Ophthalmology apt scheduled within 1 week Admits blurry vision Denies hypoglycemia, polyuria, numbness or tingling.  CHRONIC HTN: Reports no complaints Current Meds - Metoprolol, Losartan, HCTZ. Does not have Irbesartan / HCTZ combo previously rx   Reports good compliance, took meds today. Tolerating well, w/o complaints.  CKD-III - Chart review with prior work-up during hospitalization 2014, Renal US demonstrated no hydronephrosis, but noted bilateral inc echogenicity with concern for medical renal disease - Previously on Lisinopril, recently decreased dose from 20 to 10, since prescribed Losartan then Irbesartan by Cardiology - Interested in referral to Nephrology  PMH: - Followed by Cardiology (Dr. 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 Normal work of breathing. Abd - soft, NTND, no masses, +active BS MSK - bilateral upper thoracic paraspinal muscles mild +TTP with hypertonicity R>L Ext - bilateral +2 pitting edema ankles to knees, symmetrical and non-tender, distal pulses intact Skin - warm, dry, no rashes Neuro - awake, alert, oriented, grossly non-focal, intact muscle strength 5/5 b/l, intact distal sensation to light touch, gait normal     Assessment:     See specific A&P problem list for details.      Plan:     See specific A&P problem list for details.

## 2014-06-12 NOTE — Assessment & Plan Note (Signed)
Poorly controlled Last HgbA1c 14.2 (05/27/14) Complications - CKD-III, peripheral neuropathy - Follow-up Ophthalmology 1 week, request records  Plan:  1. Start Lantus 30u daily AM dosing. Discontinued Levemir 45u nightly. Anticipate will need to continue titrate up Lantus 2. Continue Glipizide 10mg  daily. Note no longer on Metformin (previously discontinued due to CKD during hospitalization)  3. Lifestyle Mods - Advised improved DM diet (dec carbs, inc vegs), regular exercise 4. Scheduled Pharmacy clinic eval in 1-2 weeks with Dr. Raymondo BandKoval - insulin titration / DM management. Instructed pt to bring CBG record (did not have at initial visit) 5. RTC for PCP f/u for DM in 2 months, repeat A1c, foot exam, request ophtho records

## 2014-06-12 NOTE — Assessment & Plan Note (Signed)
Well-controlled HTN  - Unclear current medication regimen, physical rx bottles and electronic record does not match Complication - CKD-III (in setting of uncontrolled DM2)   Plan:  1. Re-prescribed Irbesartan-HCTZ 300-12.5mg  (previously rx per Cardiology). Continue Metoprolol-XL 50mg  daily. Discontinue Losartan and HCTZ. 2. Defer lab draw today. Last checked 05/29/14 with K 4.5, Cr 1.54 (near baseline) 3. Will order future BMET, to be drawn at next f/u appointment (Pharmacy clinic 1-2 weeks) 4. Monitor BP at home

## 2014-06-12 NOTE — Patient Instructions (Addendum)
Dear Anthony SarMoises Mahn, Thank you for coming in to clinic today. It was good to meet you!  Today we discussed your Back Pain, Diabetes, Blood Pressure, and Kidney Function. 1. For your back / shoulder pain, I think it is related to muscle spasm and over use injury. I recommend taking regular Tylenol 500mg  tablets (take 2 up to 3 times daily). Please follow detailed instructions on Tylenol Bottle to avoid taking too much in 24 hours. 2. Try heating pad (from drug store), daily stretching (before work), and home massage therapy (work on back muscles). 3. For your Diabetes, I have changed your insulin to Lantus, please start with 30 units injection with breakfast in morning every day. Also keep taking the Glipizide pill daily. 4. For your Blood Pressure, I have sent a new medication to your pharmacy [facility]. 5. For your Kidneys, I will refer you to a Kidney specialist. You will be called with the appointment. This may take a few weeks.  Please continue to see the Heart Doctor. Please send the records from your Eye Doctor to our clinic.   ------------------------  Holly BodilyEstimado Kelten Asmus, Gracias por venir a la clnica hoy. Fue bueno conocerte!  Hoy hablamos Baker Hughes Incorporatedsobre el dolor de espalda, la diabetes, la presin arterial y la funcin renal. 1. Para su dolor de espalda / hombro, creo que est relacionado con el espasmo muscular y sobre el uso de la lesin. Recomiendo tomar tabletas de 500mg  Tylenol regular (tomar de 2 hasta 3 veces al da). Por favor, siga las instrucciones detalladas sobre Botella Tylenol para evitar tomar demasiado en 24 horas. 2. Trate almohadilla trmica (de farmacia), estiramientos diarios (antes del [place]abajo), y la terapia de masaje en casa (trabajo en msculos de la espalda). 3. Para su diabetes, que han cambiado su insulina a Lantus, por favor empezar con 30 unidades de inyeccin con un desayuno en la Eastman Kodakmaana todos los das. Tambin seguir tomando la pldora Glipizida diaria. 4. Para su  Presin Arterial, me han enviado un nuevo medicamento a su [facility] farmacia. 5. Para sus riones, que lo remitir a Music therapistun especialista de rin. Se le llamar con el nombramiento. Esto puede tardar un par de semanas.  Por favor, contine para ver al Engineering geologistDoctor Corazn. Por favor enve los registros de su mdico de los ojos a Technical sales engineernuestra clnica.  Por favor, programar una cita de seguimiento con [facility] - Dr. Raymondo BandKoval para el manejo de la diabetes en 2 a 4 semanas.  Por favor, programar una cita de seguimiento conmigo (Dr. Althea CharonKaramalegos) en 2 meses para la Diabetes de seguimiento.  Si usted tiene otras preguntas o inquietudes, no dude en llamar a la clnica en contacto conmigo. Tambin puede programar una cita antes si es necesario.  Sin embargo, si sus Sport and exercise psychologistsntomas significativamente peor, por favor acuda a la sala de emergencia para buscar atencin mdica inmediata.  Saralyn PilarAlexander Rein Popov, DO [facility]  Please schedule a follow-up appointment with Pharmacy Clinic - Dr. Raymondo BandKoval for Diabetes management in 2 to 4 weeks.  Please schedule a follow-up appointment with me (Dr. Althea CharonKaramalegos) in 2 months for Diabetes follow-up.  If you have any other questions or concerns, please feel free to call the clinic to contact me. You may also schedule an earlier appointment if necessary.  However, if your symptoms get significantly worse, please go to the Emergency Department to seek immediate medical attention.  Saralyn PilarAlexander Curt Oatis, DO [facility]

## 2014-06-12 NOTE — Assessment & Plan Note (Signed)
Bilateral R>L upper thoracic paraspinal spasm, likely secondary to over-use / repetitive activity  Plan: 1. Recommend Tylenol PRN (follow bottle instructions, 500mg  1-2 tabs up to TID) 2. Conservative measures, stretching, heating pad, massage therapy at home 3. Avoid NSAIDs - caution nephrotoxicity

## 2014-06-12 NOTE — Assessment & Plan Note (Addendum)
Suspected complication due to chronic uncontrolled DM2 / HTN - Recent hospitalization with AKI in setting CKD  Plan: 1. Referral to Nephrology for initial baseline assessment, recommendations. 2. Defer lab draw today. Last BMET checked 05/29/14 with Cr 1.54 (near baseline) 3. Given changing ARB today, will order future BMET, to be drawn at next f/u appointment (Pharmacy clinic 1-2 weeks).

## 2014-06-13 ENCOUNTER — Encounter: Payer: Self-pay | Admitting: *Deleted

## 2014-06-13 ENCOUNTER — Ambulatory Visit
Admission: RE | Admit: 2014-06-13 | Discharge: 2014-06-13 | Disposition: A | Discharge status: Home / Self Care | Payer: Medicaid Other | Source: Ambulatory Visit | Attending: Sports Medicine | Admitting: Sports Medicine

## 2014-06-13 DIAGNOSIS — M545 Low back pain, unspecified: Secondary | ICD-10-CM

## 2014-06-13 DIAGNOSIS — M79605 Pain in left leg: Principal | ICD-10-CM

## 2014-06-13 NOTE — Progress Notes (Signed)
Reviewed chart. Last OV with me on 06/12/14 to [facility]. Patient with HTN and DM, goal to be on ARB and HCTZ for BP control and diuretic. Previously prescribed Irbesartan/HCTZ combo by Cardiology and continued on discharge from hospital.  Review of preferred medication list, decision to switch to Losartan/HCTZ combo at this time, as this is a very appropriate starting option for this patient, previously on Losartan and HCTZ individual agents. New rx for Losartan/HCTZ 50/12.5mg  take 1 tab daily sent to pharmacy. Did not complete PA documentation at this time.  Saralyn PilarAlexander Karamalegos, DO [facility], PGY-2  ----------------------------------------------- [facility] for Irbesartan/HCTZ 300-12.5 mg tablets. Formulary and PA form placed in provider box for completion. Clovis PuMartin, Makita Blow L, RN

## 2014-06-13 NOTE — Telephone Encounter (Signed)
This encounter was created in error - please disregard.

## 2014-06-14 ENCOUNTER — Other Ambulatory Visit: Payer: Self-pay | Admitting: Family Medicine

## 2014-06-14 ENCOUNTER — Telehealth: Payer: Self-pay | Admitting: Family Medicine

## 2014-06-14 DIAGNOSIS — N183 Chronic kidney disease, stage 3 unspecified: Secondary | ICD-10-CM

## 2014-06-14 DIAGNOSIS — I129 Hypertensive chronic kidney disease with stage 1 through stage 4 chronic kidney disease, or unspecified chronic kidney disease: Secondary | ICD-10-CM

## 2014-06-14 MED ORDER — LOSARTAN POTASSIUM-HCTZ 50-12.5 MG PO TABS: 1.0000 | ORAL_TABLET | Freq: Every day | ORAL | Status: DC

## 2014-06-14 NOTE — Telephone Encounter (Signed)
Looks like Diabetes is being managed by PCP, pt also seeing Dr. Raymondo BandKoval. Will forward to PCP as an FYI.

## 2014-06-14 NOTE — Telephone Encounter (Addendum)
[facility]. Will hold off on Endocrinology referral at this time and continue to manage DM as PCP, with [facility] ([place] [facility] Future Endocrine referral can be discussed with patient at future follow-up appointments.  Saralyn PilarAlexander Karamalegos, DO [facility], PGY-2

## 2014-06-14 NOTE — Telephone Encounter (Signed)
Javanna called from Dr. Hoy RegisterBassett's office and also needs Rameen to have a referral to a Endocrinologist. Please place ASAP so that Arlyss RepressMoises can get the help he needs. Also when the referral is placed in the work queue please let me Annice Pih(Jackie) know.jw

## 2014-06-15 ENCOUNTER — Ambulatory Visit: Payer: Medicaid Other | Admitting: *Deleted

## 2014-06-21 ENCOUNTER — Ambulatory Visit: Payer: Medicaid Other | Admitting: Pharmacist

## 2014-06-26 ENCOUNTER — Telehealth: Payer: Self-pay | Admitting: Family Medicine

## 2014-06-26 DIAGNOSIS — E1142 Type 2 diabetes mellitus with diabetic polyneuropathy: Secondary | ICD-10-CM

## 2014-06-26 DIAGNOSIS — E1165 Type 2 diabetes mellitus with hyperglycemia: Principal | ICD-10-CM

## 2014-06-26 MED ORDER — INSULIN PEN NEEDLE 32G X 4 MM MISC: Status: DC

## 2014-06-26 NOTE — Telephone Encounter (Signed)
Forward to PCP for refill request.Phillip Murphy  

## 2014-06-26 NOTE — Telephone Encounter (Signed)
Needs refill on bd pin needle nano 32x mm 532grn, [facility] [address]

## 2014-06-26 NOTE — Telephone Encounter (Signed)
Refill BD Pen Needle Nano 32g [facility]  Saralyn PilarAlexander Enoc Getter, DO [facility], PGY-2

## 2014-06-27 ENCOUNTER — Telehealth: Payer: Self-pay | Admitting: *Deleted

## 2014-06-27 NOTE — Telephone Encounter (Signed)
Bjorn LoserRhonda from [facility] called stating that Dr. Clinton SawyerWilliamson referred pt 06/01/14 and pt is rated #3.  It will take them at least 5-6 months before they can schedule him an appt.  If you have question please give her a call at [phone] ext [phone].  Clovis PuMartin, Tamika L, RN

## 2014-06-28 NOTE — Telephone Encounter (Signed)
Referral re-faxed to [facility] [place] to see if they can get patient in sooner.

## 2014-07-04 NOTE — Telephone Encounter (Signed)
Appt scheduled with [facility] for 8/18 at 2pm. Patient aware.

## 2014-07-05 ENCOUNTER — Ambulatory Visit: Payer: Medicaid Other | Admitting: Endocrinology

## 2014-07-11 ENCOUNTER — Ambulatory Visit: Payer: Medicaid Other | Admitting: Dietician

## 2014-07-13 ENCOUNTER — Ambulatory Visit (INDEPENDENT_AMBULATORY_CARE_PROVIDER_SITE_OTHER): Payer: Medicaid Other | Admitting: Family Medicine

## 2014-07-13 ENCOUNTER — Ambulatory Visit (HOSPITAL_COMMUNITY)
Admission: RE | Admit: 2014-07-13 | Discharge: 2014-07-13 | Disposition: A | Discharge status: Home / Self Care | Payer: Medicaid Other | Source: Ambulatory Visit | Attending: Family Medicine | Admitting: Family Medicine

## 2014-07-13 ENCOUNTER — Encounter: Payer: Self-pay | Admitting: Family Medicine

## 2014-07-13 VITALS — BP 128/71 | HR 70 | Temp 98.3°F | Ht 66.0 in | Wt 261.4 lb

## 2014-07-13 DIAGNOSIS — E119 Type 2 diabetes mellitus without complications: Secondary | ICD-10-CM | POA: Insufficient documentation

## 2014-07-13 DIAGNOSIS — N189 Chronic kidney disease, unspecified: Secondary | ICD-10-CM | POA: Insufficient documentation

## 2014-07-13 DIAGNOSIS — I129 Hypertensive chronic kidney disease with stage 1 through stage 4 chronic kidney disease, or unspecified chronic kidney disease: Secondary | ICD-10-CM | POA: Insufficient documentation

## 2014-07-13 DIAGNOSIS — R079 Chest pain, unspecified: Secondary | ICD-10-CM | POA: Diagnosis present

## 2014-07-13 MED ORDER — OMEPRAZOLE 20 MG PO CPDR: 20.0000 mg | DELAYED_RELEASE_CAPSULE | Freq: Every day | ORAL | Status: DC

## 2014-07-13 MED ORDER — GI COCKTAIL ~~LOC~~
30.0000 mL | Freq: Once | ORAL | Status: AC
  Administered 2014-07-13: 30 mL via ORAL

## 2014-07-13 NOTE — Progress Notes (Signed)
   Subjective:    Patient ID: Phillip Murphy, male    DOB: 10/22/1966, 48 y.o.   MRN: [id number]  HPI Comments: Mr Trisha MangleDiaz comes in today complaining of chest pain.  The chest pain began 3 days ago, after an episode of vomiting after receiving dye at the ophthalmologist.  It has been constant since he describes it as tightness and burning.  He points to his sternal/epigastric region when localizing the pain, and it does not radiate.  Eating seems to make it worse, and Zantac has helped some.  He denies any shortness of breath, coughing, fevers, rashes, additional nausea, vomiting, or diarrhea.  He reports prior episodes of epigastric burning post meals.  Denies any history of heart attack or stroke.      Review of Systems See HPI     Objective:   Physical Exam  Vitals reviewed. Constitutional: He appears well-developed. No distress.  Obese   Cardiovascular: Normal rate and regular rhythm.   No murmur heard. NO LE edema  Pulmonary/Chest: Effort normal and breath sounds normal. No respiratory distress. He has no wheezes.  Abdominal: Soft. He exhibits no distension. There is no guarding.  Epigastric tenderness   Skin: He is not diaphoretic.   Assessment/Plan:      See Problem Focused Assessment & Plan

## 2014-07-13 NOTE — Assessment & Plan Note (Addendum)
History consistent with GI etiology of chest pain; however, has multiple risk factors for cardiac problems: CKD, HTN, DM uncontrolled. Pain improved with GI cocktail - EKG: Unchanged from previous  - Prilosec 20 mg qd - f/u with PCP for ongoing risk factor management; Consideration of ASA and ? Statin - checking Cholesterol today

## 2014-07-13 NOTE — Patient Instructions (Signed)
It was great seeing you today.   1. Your pain is likely caused by acid reflux from your stomach. Take Prilosec every day and come back to see your primary care doctor Althea Charon([place]) in 1 week.  2. I will check your cholesterol today.  If you have any questions or concerns before then, please call the clinic at [phone].  Take Care,   Dr Wenda LowJames Zakhari Fogel

## 2014-07-14 LAB — LIPID PANEL
CHOLESTEROL: 134 mg/dL (ref 0–200)
HDL: 28 mg/dL — AB (ref >39)
LDL Cholesterol: 62 mg/dL (ref 0–99)
TRIGLYCERIDES: 218 mg/dL — AB (ref <150)
Total CHOL/HDL Ratio: 4.8 Ratio
VLDL: 44 mg/dL — ABNORMAL HIGH (ref 0–40)

## 2014-07-19 ENCOUNTER — Encounter (HOSPITAL_COMMUNITY): Payer: Self-pay | Admitting: Emergency Medicine

## 2014-07-19 ENCOUNTER — Emergency Department (HOSPITAL_COMMUNITY): Payer: Medicaid Other

## 2014-07-19 ENCOUNTER — Ambulatory Visit: Payer: Medicaid Other | Admitting: Family Medicine

## 2014-07-19 ENCOUNTER — Emergency Department (HOSPITAL_COMMUNITY)
Admission: EM | Admit: 2014-07-19 | Discharge: 2014-07-19 | Disposition: A | Discharge status: Home / Self Care | Payer: Medicaid Other | Attending: Emergency Medicine | Admitting: Emergency Medicine

## 2014-07-19 DIAGNOSIS — I10 Essential (primary) hypertension: Secondary | ICD-10-CM | POA: Diagnosis not present

## 2014-07-19 DIAGNOSIS — M545 Low back pain, unspecified: Secondary | ICD-10-CM | POA: Diagnosis not present

## 2014-07-19 DIAGNOSIS — M549 Dorsalgia, unspecified: Secondary | ICD-10-CM | POA: Diagnosis present

## 2014-07-19 DIAGNOSIS — Z794 Long term (current) use of insulin: Secondary | ICD-10-CM | POA: Diagnosis not present

## 2014-07-19 DIAGNOSIS — Z79899 Other long term (current) drug therapy: Secondary | ICD-10-CM | POA: Diagnosis not present

## 2014-07-19 DIAGNOSIS — R079 Chest pain, unspecified: Secondary | ICD-10-CM | POA: Diagnosis not present

## 2014-07-19 DIAGNOSIS — E119 Type 2 diabetes mellitus without complications: Secondary | ICD-10-CM | POA: Diagnosis not present

## 2014-07-19 LAB — I-STAT TROPONIN, ED: TROPONIN I, POC: 0 ng/mL (ref 0.00–0.08)

## 2014-07-19 LAB — URINALYSIS, ROUTINE W REFLEX MICROSCOPIC
BILIRUBIN URINE: NEGATIVE
Glucose, UA: 500 mg/dL — AB
Ketones, ur: NEGATIVE mg/dL
Leukocytes, UA: NEGATIVE
Nitrite: NEGATIVE
Specific Gravity, Urine: 1.015 (ref 1.005–1.030)
Urobilinogen, UA: 0.2 mg/dL (ref 0.0–1.0)
pH: 6.5 (ref 5.0–8.0)

## 2014-07-19 LAB — BASIC METABOLIC PANEL
ANION GAP: 10 (ref 5–15)
BUN: 41 mg/dL — ABNORMAL HIGH (ref 6–23)
CALCIUM: 8.9 mg/dL (ref 8.4–10.5)
CO2: 25 mEq/L (ref 19–32)
Chloride: 105 mEq/L (ref 96–112)
Creatinine, Ser: 1.89 mg/dL — ABNORMAL HIGH (ref 0.50–1.35)
GFR calc Af Amer: 47 mL/min — ABNORMAL LOW (ref >90)
GFR calc non Af Amer: 40 mL/min — ABNORMAL LOW (ref >90)
Glucose, Bld: 284 mg/dL — ABNORMAL HIGH (ref 70–99)
Potassium: 4.9 mEq/L (ref 3.7–5.3)
Sodium: 140 mEq/L (ref 137–147)

## 2014-07-19 LAB — CBC
HCT: 34.4 % — ABNORMAL LOW (ref 39.0–52.0)
Hemoglobin: 11.7 g/dL — ABNORMAL LOW (ref 13.0–17.0)
MCH: 29.9 pg (ref 26.0–34.0)
MCHC: 34 g/dL (ref 30.0–36.0)
MCV: 88 fL (ref 78.0–100.0)
PLATELETS: 257 10*3/uL (ref 150–400)
RBC: 3.91 MIL/uL — ABNORMAL LOW (ref 4.22–5.81)
RDW: 12.2 % (ref 11.5–15.5)
WBC: 12.1 10*3/uL — ABNORMAL HIGH (ref 4.0–10.5)

## 2014-07-19 LAB — URINE MICROSCOPIC-ADD ON

## 2014-07-19 MED ORDER — HYDROCODONE-ACETAMINOPHEN 5-325 MG PO TABS: 1.0000 | ORAL_TABLET | ORAL | Status: AC | PRN

## 2014-07-19 MED ORDER — PANTOPRAZOLE SODIUM 20 MG PO TBEC: 20.0000 mg | DELAYED_RELEASE_TABLET | Freq: Two times a day (BID) | ORAL | Status: AC

## 2014-07-19 MED ORDER — GI COCKTAIL ~~LOC~~
30.0000 mL | Freq: Once | ORAL | Status: AC
  Administered 2014-07-19: 30 mL via ORAL
  Filled 2014-07-19: qty 30

## 2014-07-19 NOTE — ED Provider Notes (Signed)
CSN: [id number]     Arrival date & time 07/19/14  0865 History   First MD Initiated Contact with Patient 07/19/14 0328     Chief Complaint  Patient presents with  . Chest Pain  . Back Pain     (Consider location/radiation/quality/duration/timing/severity/associated sxs/prior Treatment) HPI Comments: Patient is a 48 year old male with history of diabetes and hypertension. He presents with complaints of burning in his chest that feels like acid reflux. He was prescribed omeprazole this week but this is not working. He denies difficulty breathing, diaphoresis, radiation to the arm or jaw. There is no exertional component.  He is also complaining of discomfort in his low back. He denies any injury or trauma. He denies any radiation into his legs. He denies any bowel or bladder complaints. He denies any weakness or numbness.  The patient speaks very little English and the history was taken with the help of the translator line.  Patient is a 48 y.o. male presenting with chest pain and back pain. The history is provided by the patient.  Chest Pain Pain location:  Substernal area Pain quality: burning   Pain radiates to:  Does not radiate Pain radiates to the back: no   Pain severity:  Moderate Onset quality:  Gradual Duration:  1 week Timing:  Constant Progression:  Worsening Chronicity:  New Relieved by:  Nothing Worsened by:  Nothing tried Ineffective treatments:  None tried Associated symptoms: back pain   Back Pain Associated symptoms: chest pain     Past Medical History  Diagnosis Date  . Diabetes mellitus without complication   . Hypertension    Past Surgical History  Procedure Laterality Date  . Appendectomy     Family History  Problem Relation Age of Onset  . Hypertension Mother   . Diabetes Mother   . Diabetes Father   . Diabetes Sister   . Diabetes Brother   . Diabetes Maternal Aunt   . Diabetes Brother   . Diabetes Brother    History  Substance Use Topics   . Smoking status: Never Smoker   . Smokeless tobacco: Never Used  . Alcohol Use: Yes    Review of Systems  Cardiovascular: Positive for chest pain.  Musculoskeletal: Positive for back pain.  All other systems reviewed and are negative.     Allergies  Review of patient's allergies indicates no known allergies.  Home Medications   Prior to Admission medications   Medication Sig Start Date End Date Taking? Authorizing Provider  glipiZIDE (GLUCOTROL) 10 MG tablet Take 10 mg by mouth daily before breakfast.   Yes Historical Provider, MD  Insulin Glargine (LANTUS SOLOSTAR) 100 UNIT/ML Solostar Pen Inject 30 Units into the skin daily at 10 pm.   Yes Historical Provider, MD  losartan-hydrochlorothiazide (HYZAAR) 50-12.5 MG per tablet Take 1 tablet by mouth daily.   Yes Historical Provider, MD  metoprolol succinate (TOPROL-XL) 50 MG 24 hr tablet Take 50 mg by mouth daily. Take with or immediately following a meal.   Yes Historical Provider, MD  omeprazole (PRILOSEC) 20 MG capsule Take 20 mg by mouth daily.   Yes Historical Provider, MD   BP 182/75  Pulse 70  Temp(Src) 98.2 F (36.8 C) (Oral)  Resp 20  SpO2 100% Physical Exam  Nursing note and vitals reviewed. Constitutional: He is oriented to person, place, and time. He appears well-developed and well-nourished. No distress.  HENT:  Head: Normocephalic and atraumatic.  Mouth/Throat: Oropharynx is clear and moist.  Neck: Normal range  of motion. Neck supple.  Cardiovascular: Normal rate, regular rhythm and normal heart sounds.   No murmur heard. Pulmonary/Chest: Effort normal and breath sounds normal. No respiratory distress. He has no wheezes.  Abdominal: Soft. He exhibits no distension. There is no tenderness. There is no rebound and no guarding.  Musculoskeletal: Normal range of motion.  There is tenderness to palpation in the soft tissues of the lumbar region.  Neurological: He is alert and oriented to person, place, and time.   DTRs are 1+ and equal in bilateral lower extremities. Strength is 5 out of 5 in the bilateral lower extremities. He is able ambulate without difficulty.  Skin: Skin is warm and dry. He is not diaphoretic.    ED Course  Procedures (including critical care time) Labs Review Labs Reviewed  CBC - Abnormal; Notable for the following:    WBC 12.1 (*)    RBC 3.91 (*)    Hemoglobin 11.7 (*)    HCT 34.4 (*)    All other components within normal limits  BASIC METABOLIC PANEL - Abnormal; Notable for the following:    Glucose, Bld 284 (*)    BUN 41 (*)    Creatinine, Ser 1.89 (*)    GFR calc non Af Amer 40 (*)    GFR calc Af Amer 47 (*)    All other components within normal limits  URINALYSIS, ROUTINE W REFLEX MICROSCOPIC - Abnormal; Notable for the following:    Glucose, UA 500 (*)    Hgb urine dipstick SMALL (*)    Protein, ur >300 (*)    All other components within normal limits  URINE MICROSCOPIC-ADD ON  Rosezena SensorI-STAT TROPOININ, ED    Imaging Review Dg Chest Port 1 View  07/19/2014   CLINICAL DATA:  Chest pain and shortness of breath.  EXAM: PORTABLE CHEST - 1 VIEW  COMPARISON:  Chest radiograph performed 05/27/2014  FINDINGS: The lungs are well-aerated. Minimal bibasilar opacities likely reflect atelectasis. There is no evidence of pleural effusion or pneumothorax.  The cardiomediastinal silhouette is within normal limits. No acute osseous abnormalities are seen.  IMPRESSION: Minimal bibasilar opacities likely reflect atelectasis; lungs otherwise clear.   Electronically Signed   By: Roanna RaiderJeffery  Chang M.D.   On: 07/19/2014 03:08     EKG Interpretation   Date/Time:  Thursday July 19 2014 02:45:18 EDT Ventricular Rate:  73 PR Interval:  162 QRS Duration: 88 QT Interval:  394 QTC Calculation: 434 R Axis:   43 Text Interpretation:  Normal sinus rhythm Normal ECG Confirmed by DELOS   MD, Logann Whitebread ([id number]) on 07/19/2014 3:12:28 AM      MDM   Final diagnoses:  None    Workup reveals  no evidence for a cardiac etiology and all laboratory studies are essentially unremarkable. This sounds like an acid reflux situation which will be treated with protonix. I will also give him pain medication which he can take as needed for his back. He has a primary Dr. and he is to followup with them next week to be rechecked.    Geoffery Lyonsouglas Arrielle Mcginn, MD 07/19/14 [id number]

## 2014-07-19 NOTE — Discharge Instructions (Signed)
Protonix as prescribed.  Hydrocodone as prescribed as needed for pain.  Followup with your primary Dr. next week.   Dolor de pecho (no especfico) (Chest Pain (Nonspecific)) Con frecuencia es difcil dar un diagnstico especfico de la causa del dolor de [place]. Siempre hay una posibilidad de que el dolor podra estar relacionado con algo grave, como un ataque al corazn o un cogulo sanguneo en los pulmones. Debe someterse a controles con el mdico para ms evaluaciones. CAUSAS   Acidez.  Neumona o bronquitis.  Ansiedad o estrs.  Inflamacin de la zona que rodea al corazn (pericarditis) o a los pulmones (pleuritis o pleuresa).  Un cogulo sanguneo en el pulmn.  Colapso de un pulmn (neumotrax), que puede aparecer de [employer] repentina por s solo (neumotrax espontneo) o debido a un traumatismo en el trax.  Culebrilla (virus del herpes zster). La pared torcica est compuesta por huesos, msculos y TEFL teacher. Cualquiera de estos puede ser la fuente del dolor.  Puede haber una contusin en los huesos debido a una lesin.  Puede haber un esguince en los msculos o el cartlago ocasionado por la tos o por [place].  El cartlago puede verse afectado por una inflamacin y Psychologist, counselling (costocondritis). DIAGNSTICO  Gretchen Short se necesiten anlisis de laboratorio u otros estudios para Veterinary surgeon causa del Engineer, mining. Adems, puede indicarle que se haga una prueba llamada electrocadiograma (ECG) ambulatorio. El ECG registra los patrones de los latidos cardacos durante 24horas. Adems, pueden hacerle otros estudios, por ejemplo:  Ecocardiograma transtorcico (ETT). Durante Management consultant, se usan ondas sonoras para evaluar el flujo de la sangre a travs del corazn.  Ecocardiograma transesofgico (ETE).  Monitoreo cardaco. Permite que el mdico controle la frecuencia y el ritmo cardaco en tiempo real.  Monitor Holter. Es un dispositivo porttil que [employer]  latidos cardacos y [place] a Education administrator las arritmias cardacas. Le permite al [employer] registrar la actividad cardaca durante varios das, si es necesario.  Pruebas de estrs por ejercicio o por medicamentos que aceleran los latidos cardacos. TRATAMIENTO   El tratamiento depende de la causa del dolor de [place]. El tratamiento puede incluir:  Inhibidores de la acidez estomacal.  Antiinflamatorios.  Analgsicos para las enfermedades inflamatorias.  Antibiticos, si hay una infeccin.  Podrn aconsejarle que modifique su estilo de vida. Esto incluye dejar de fumar y evitar el alcohol, la cafena y el chocolate.  Pueden aconsejarle que mantenga la cabeza levantada (elevada) cuando duerme. Esto reduce la probabilidad de que el cido retroceda del estmago al esfago. En la [employer], el dolor de pecho no especfico mejorar en el trmino de 2 a 3das, con reposo y IAC[employer]Active[employer].  INSTRUCCIONES PARA EL CUIDADO EN EL HOGAR   Si le prescriben antibiticos, tmelos tal como se le indic. Termnelos aunque comience a sentirse mejor.  [address] no haga actividades fsicas que provoquen dolor de [place]. Contine con las actividades fsicas tal como se le indic  No consuma ningn producto que contenga tabaco, incluidos cigarrillos, tabaco de Theatre manager o cigarrillos electrnicos.  Evite el consumo de alcohol.  Tome los medicamentos solamente como se lo haya indicado el mdico.  Siga las sugerencias del mdico en lo que respecta a las pruebas adicionales, si el dolor de pecho no desaparece.  Concurra a todas las visitas de control programadas. Si no lo hace, podra desarrollar problemas permanentes (crnicos) relacionados con el dolor. Si hay algn problema para concurrir a una cita, llame para reprogramarla. SOLICITE ATENCIN MDICA SI:  El dolor de pecho no desaparece, incluso despus del tratamiento.  Tiene una erupcin cutnea con ampollas en el  pecho.  Tiene fiebre. SOLICITE ATENCIN MDICA DE Engelhard CorporationNMEDIATO SI:   Aumenta el dolor de pecho o este se irradia hacia el brazo, el cuello, la [place] Citymandbula, la espalda o el abdomen.  Le falta el aire.  La tos empeora, o expectora sangre.  Siente dolor intenso en la espalda o el abdomen.  Se siente nauseoso o vomita.  Siente debilidad intensa.  Se desmaya.  Tiene escalofros. Esto es Radio broadcast assistantuna emergencia. No espere a ver si el dolor se pasa. Obtenga ayuda mdica de inmediato. Llame a los servicios de emergencia locales (911 en los [place]). No conduzca por sus propios medios Dollar Generalhasta el hospital. ASEGRESE DE QUE:   Comprende estas instrucciones.  Controlar su afeccin.  Recibir ayuda de inmediato si no mejora o si empeora. Document Released: 11/23/2005 Document Revised: 11/28/2013 [facility] Patient Information 2015 [place], [place]. This information is not intended to replace advice given to you by your health care provider. Make sure you discuss any questions you have with your health care provider.  Dolor de espalda, adultos  (Back Pain, Adult)  El dolor de espalda es frecuente. Con frecuencia mejora luego de algn tiempo. La causa suele no ser un peligro para la vida. La mayora de las personas aprende a controlarlo por sus propios medios.  CUIDADOS EN EL HOGAR   Mantngase fsicamente activo. Si puede, comience a dar cortas caminatas en un suelo plano. Trate de caminar un poco ms cada da.  Nopermanezca sentado, de pie ni conduzca automviles durante ms de 30 minutos seguidos. Nose quede en la cama.  Noevite los ejercicios ni el trabajo. La actividad puede ayudar a que la espalda se cure ms rpido.  Tenga cuidado al inclinarse o al levantar un objeto. Doble las rodillas, mantenga el Gardnerobjeto cerca de su cuerpo y no gire.  Duerma sobre un NVR Inccolchn firme. Acustese sobre un lado y doble las rodillas. Si se Citigroupacuesta sobre la espalda, coloque una almohada debajo de las  rodillas.  Tome la medicacin slo como le haya indicado el mdico.  Aplique hielo sobre la zona lesionada.  Ponga el hielo en una bolsa plstica.  Colquese una toalla entre la piel y la bolsa de hielo.  Deje la bolsa de hielo durante 15 a 20minutos 3 a 4 veces por da, durante los primeros 2 o 3 das. Luego puede ir alternando entre hielo y compresas calientes.  Consulte a su mdico sobre cul ejercicios o IT sales professionalmasajes puede hacer.  Evite sentirse ansioso o estresado. Encuentre la forma de enfrentar el estrs, como por Lexicographerejemplo practicar ejercicios. SOLICITE AYUDA DE INMEDIATO SI:   El dolor no desaparece aunque haga reposo o tome medicamentos para Chief Technology Officerel dolor.  El dolor no desaparece en una semana.  Tiene nuevos problemas.  No se siente mejor.  El dolor se extiende a las piernas.  No puede controlar la orina o la materia fecal.  Siente que los brazos estn dbiles o pierde la sensibilidad (estn adormecidos).  Tiene Programme researcher, broadcasting/film/videomalestar estomacal (nuseas) y vmitos.  Siente dolor abdominal.  Siente que se desvanece (se desmaya). ASEGRESE DE QUE:   Comprende estas instrucciones.  Controlar su enfermedad.  Solicitar ayuda de inmediato si no mejora o si empeora. Document Released: 06/08/2011 Document Revised: 02/15/2012 [facility] Patient Information 2015 [place]ExitCare, [place]LLC. This information is not intended to replace advice given to you by your health care provider. Make sure you discuss any questions  you have with your health care provider.

## 2014-07-19 NOTE — ED Notes (Signed)
Pt reports lower back pain for 2 months, denies injury, urinary sx. Also endorses mid sternal cp. sts he has been checked this week for the cp and given reflux medications but they aren't working. Denies sob.

## 2014-07-31 ENCOUNTER — Ambulatory Visit: Payer: Medicaid Other | Admitting: *Deleted

## 2014-08-06 ENCOUNTER — Other Ambulatory Visit: Payer: Self-pay | Admitting: *Deleted

## 2014-08-06 MED ORDER — METOPROLOL SUCCINATE ER 50 MG PO TB24: 50.0000 mg | ORAL_TABLET | Freq: Every day | ORAL | Status: AC

## 2014-08-09 ENCOUNTER — Ambulatory Visit (INDEPENDENT_AMBULATORY_CARE_PROVIDER_SITE_OTHER): Payer: Self-pay | Admitting: Family Medicine

## 2014-08-09 ENCOUNTER — Other Ambulatory Visit: Payer: Self-pay | Admitting: Family Medicine

## 2014-08-09 ENCOUNTER — Encounter: Payer: Self-pay | Admitting: Family Medicine

## 2014-08-09 VITALS — BP 181/83 | HR 60 | Temp 98.0°F | Ht 66.0 in | Wt 251.0 lb

## 2014-08-09 DIAGNOSIS — M5380 Other specified dorsopathies, site unspecified: Secondary | ICD-10-CM

## 2014-08-09 DIAGNOSIS — M62830 Muscle spasm of back: Secondary | ICD-10-CM

## 2014-08-09 MED ORDER — CYCLOBENZAPRINE HCL 5 MG PO TABS: 5.0000 mg | ORAL_TABLET | Freq: Three times a day (TID) | ORAL | Status: AC | PRN

## 2014-08-09 NOTE — Patient Instructions (Addendum)
Thank you for coming to see me today. It was a pleasure. Today we talked about:   Back pain: you seem to have a muscle spasm. I will prescribe Flexeril for your pain. You can also use Tylenol  -  three times per day and ice on the area to help with the pain.   Please make an appointment to see Dr. Althea Charon in 4 weeks for a complete physical.  If you have any questions or concerns, please do not hesitate to call the office at [phone].  Sincerely,  Jacquelin Hawking, MD  Calambres y espasmos musculares  (Muscle Cramps and Spasms)  Se denominan calambres y espasmos musculares cuando los msculos se comprimen. Generalmente mejoran en unos minutos. Los calambres musculares son dolorosos. Son ms fuertes y duran ms que los espasmos musculares. Los espasmos pueden o no ser dolorosos. Pueden durar algunos segundos o mucho ms. CUIDADOS EN EL HOGAR   Beba gran cantidad de lquido para mantener el pis (orina) de tono claro o amarillo plido.  [place], estire y relaje el msculo.  Aplique una toalla hmeda, una almohadilla trmica o moje con agua tibia los msculos acalambrados.  Coloque una bolsa de hielo sobre la zona si le duele o est inflamada.  Ponga el hielo en una bolsa plstica.  Colquese una toalla entre la piel y la bolsa de hielo.  Deje el hielo en el lugar durante 15 a 20 minutos, 3 a 4 veces por da.  Slo tome los medicamentos que le indique el mdico. SOLICITE AYUDA DE INMEDIATO SI:  Los calambres o espasmos empeoran, ocurren con ms frecuencia o no mejoran con [employer].  ASEGRESE DE QUE:   Comprende estas instrucciones.  Controlar su enfermedad.  Solicitar ayuda de inmediato si no mejora o si empeora. Document Released: 02/19/2009 Document Revised: 03/20/2013 [facility] Patient Information 2015 [place], [place]. This information is not intended to replace advice given to you by your health care provider. Make sure you discuss any questions you have  with your health care provider.

## 2014-08-09 NOTE — Progress Notes (Signed)
    Subjective   Phillip Murphy is a 48 y.o. male that presents for a same day visit  1. Lower back pain: Started three months ago. Worse at night time. Better in day time. Pain is sharp and has numbness. No radiation. Vicodin has helped. No problems with gait. Has not tried anything else.  History  Substance Use Topics  . Smoking status: Never Smoker   . Smokeless tobacco: Never Used  . Alcohol Use: Yes    ROS Per HPI  Objective   BP 181/83  Pulse 60  Temp(Src) 98 F (36.7 C) (Oral)  Ht  (1.676 m)  Wt 251 lb (113.853 kg)  BMI 40.53 kg/m2  General: Well appearing, in no acute distress Musculoskeletal: muscle spasm felt around left lower back with tenderness in area. No spine tenderness  Assessment and Plan   Please refer to problem based charting of assessment and plan

## 2014-08-12 DIAGNOSIS — M62830 Muscle spasm of back: Secondary | ICD-10-CM | POA: Insufficient documentation

## 2014-08-12 NOTE — Assessment & Plan Note (Signed)
Patient with significant spasm of back. No radiation of pain to suspect sciatica. Will prescribe Flexeril and recommend Tylenol as needed.

## 2014-09-11 ENCOUNTER — Encounter: Payer: Medicaid Other | Admitting: Family Medicine

## 2015-02-06 IMAGING — CR DG ANKLE COMPLETE 3+V*R*
3 series · 3 of 3 positions shown · non-contrast
Comparison: None.

CLINICAL DATA: Fall, right ankle pain/swelling

RIGHT ANKLE - COMPLETE 3+ VIEW

[x ankle ap right]
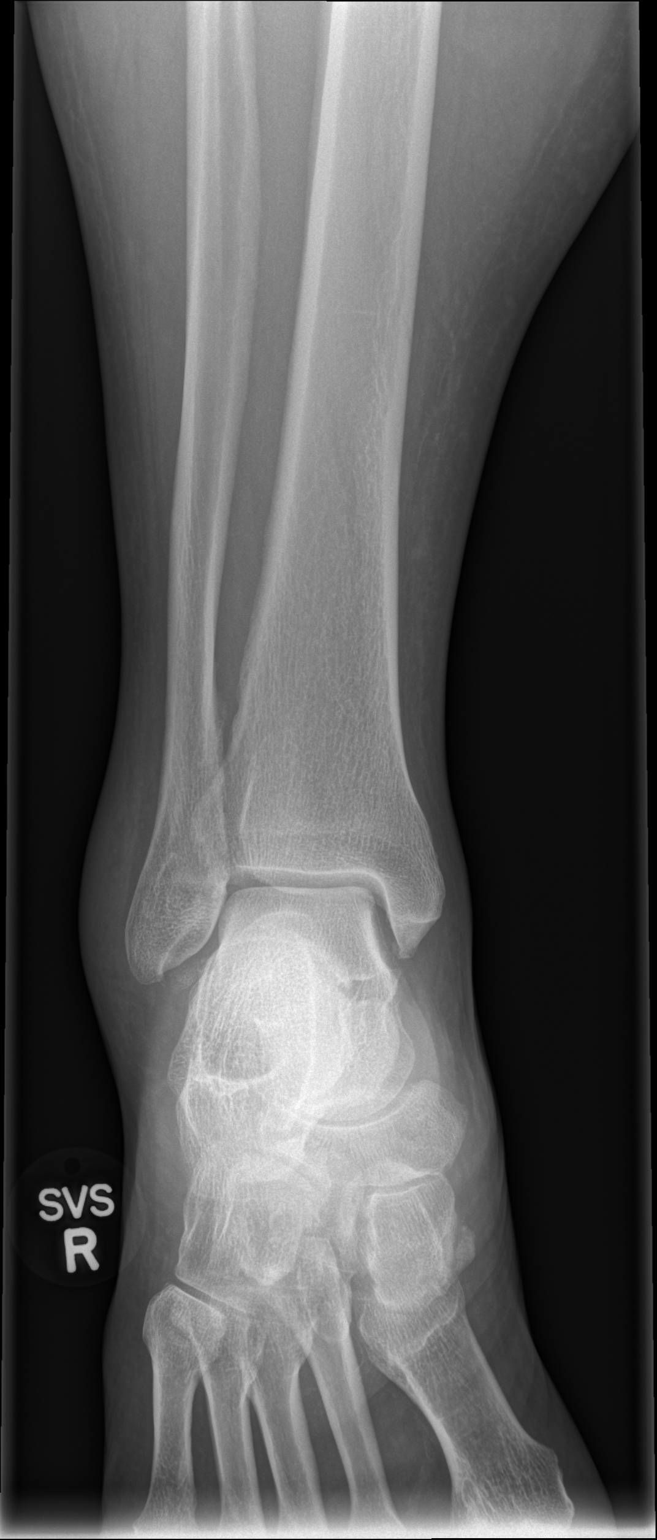

[x ankle obl right]
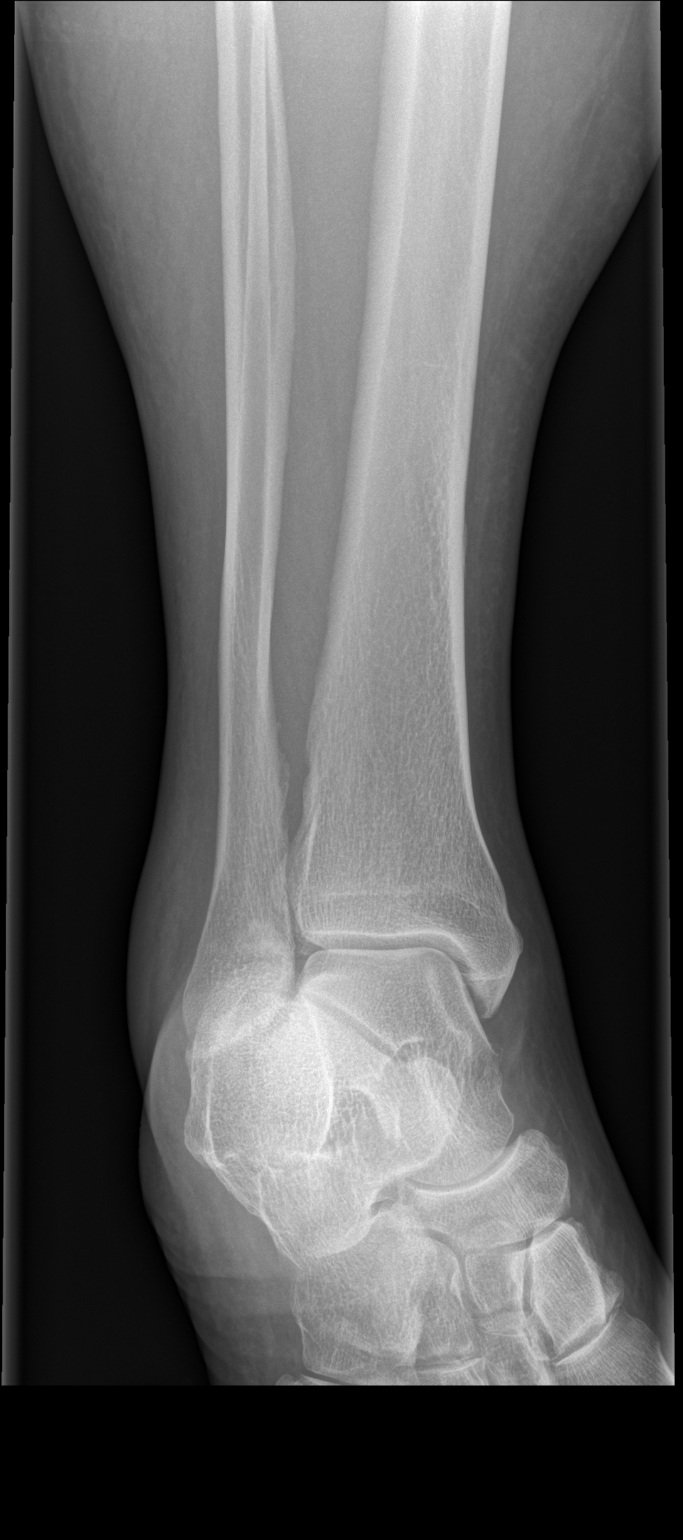

[x ankle lat right]
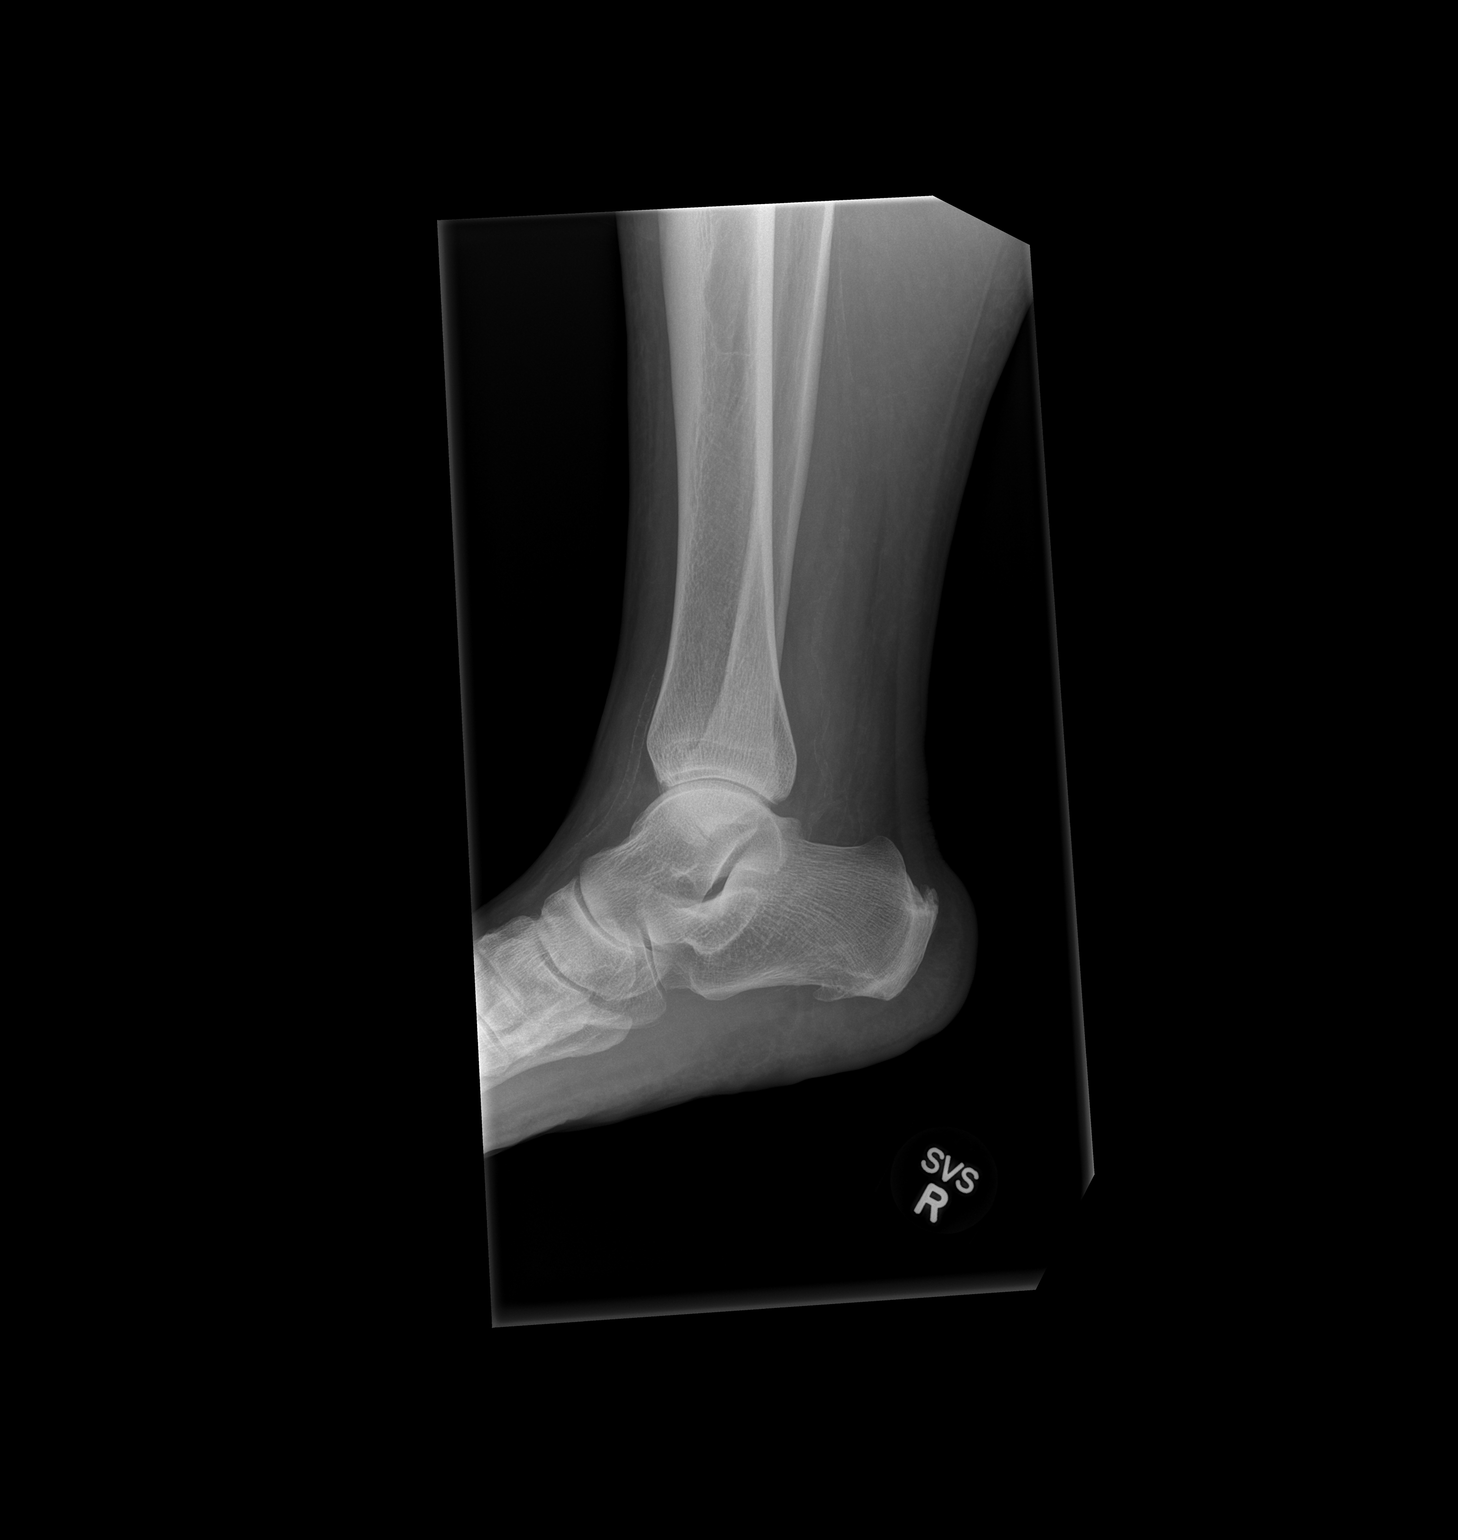

[3 of 3 positions shown; findings below may reference images not displayed]

FINDINGS: No fracture or dislocation is seen.

The ankle mortise is intact.

Well corticated osseous density inferior to the lateral malleolus,
not acute.

The base of the fifth metatarsal is unremarkable.

Small plantar and posterior calcaneal enthesophytes.

Mild lateral soft tissue swelling.
IMPRESSION: No fracture or dislocation is seen.

Mild lateral soft tissue swelling.

## 2015-02-25 ENCOUNTER — Telehealth: Payer: Self-pay | Admitting: *Deleted

## 2015-02-25 NOTE — Telephone Encounter (Signed)
Attempted to call pt and set up an appt with a spanish interpreter [phone] pcp for diabetes and they dont have a working number. Deseree Bruna PotterBlount, CMA

## 2016-01-25 IMAGING — MR MR LUMBAR SPINE W/O CM
4 of 5 series · 27 of 48 positions shown · non-contrast
Comparison: None.

CLINICAL DATA: Low back pain

EXAM:
MRI LUMBAR SPINE WITHOUT CONTRAST
TECHNIQUE: Multiplanar, multisequence MR imaging of the lumbar spine was
performed. No intravenous contrast was administered.

[Series 3: T2 · sagittal · 4.0mm · 0.55mm/px · 6 of 12 slices shown (1 of 2)]
[im 1/12]
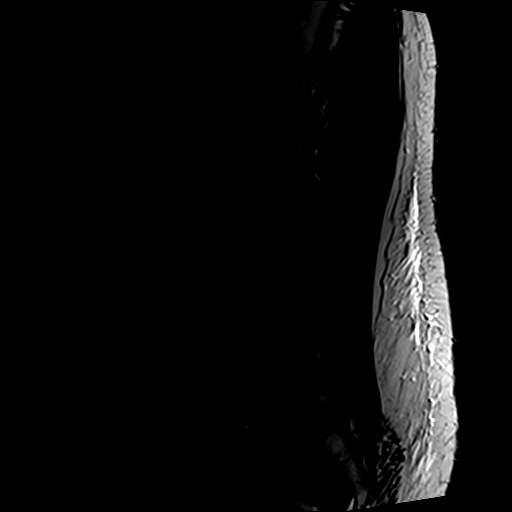
[im 3/12]
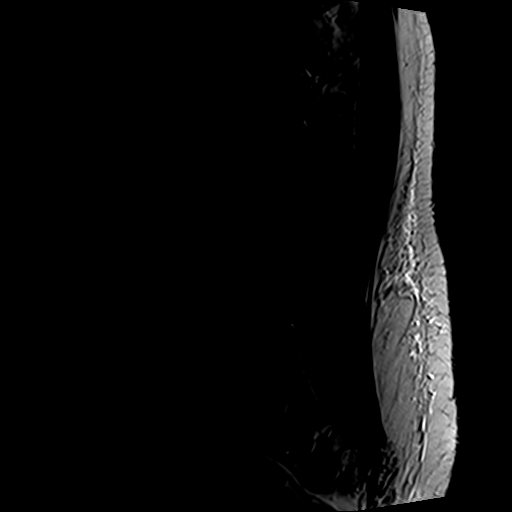
[im 5/12]
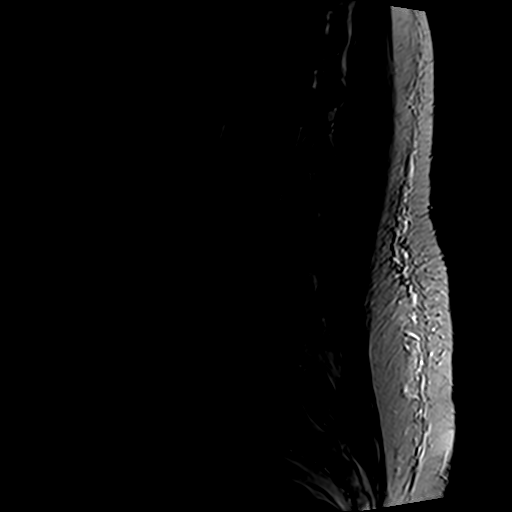
[im 7/12]
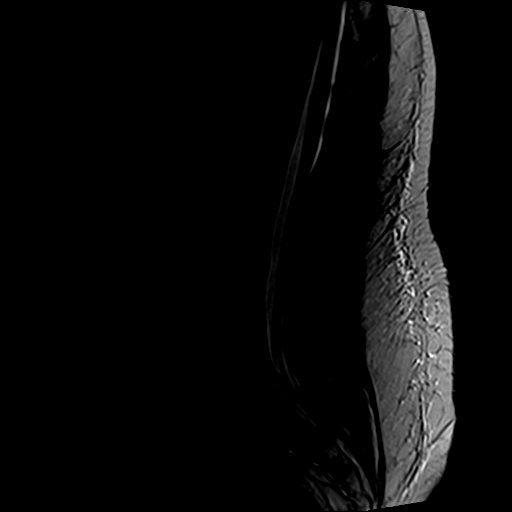
[im 9/12]
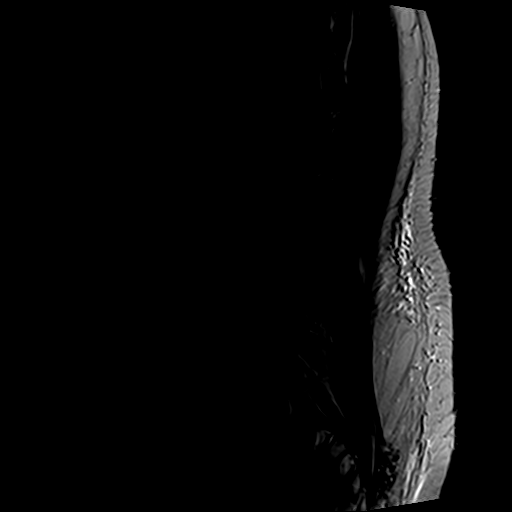
[im 12/12]
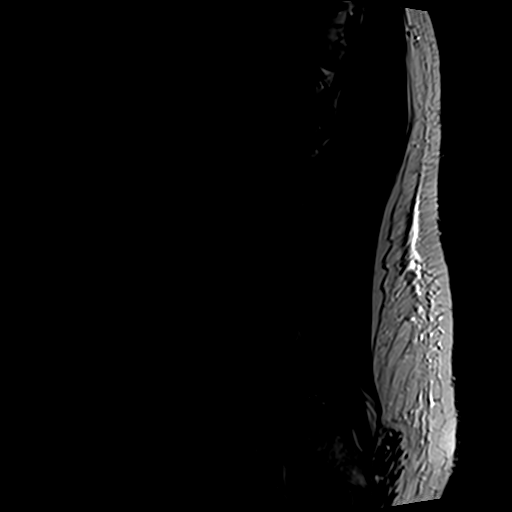

[Series 4: T1 · sagittal · 4.0mm · 0.55mm/px · 5 of 12 slices shown (1 of 2)]
[im 1/12]
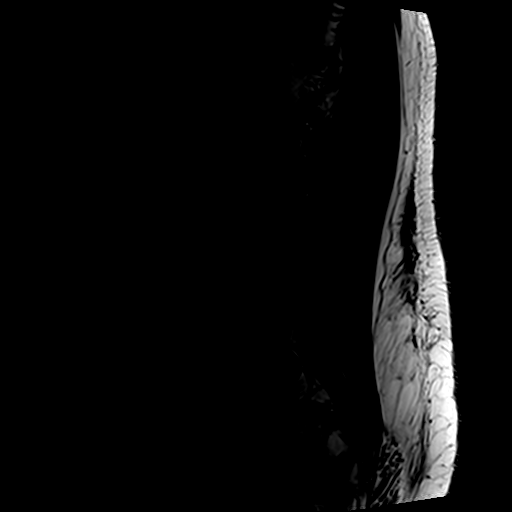
[im 3/12]
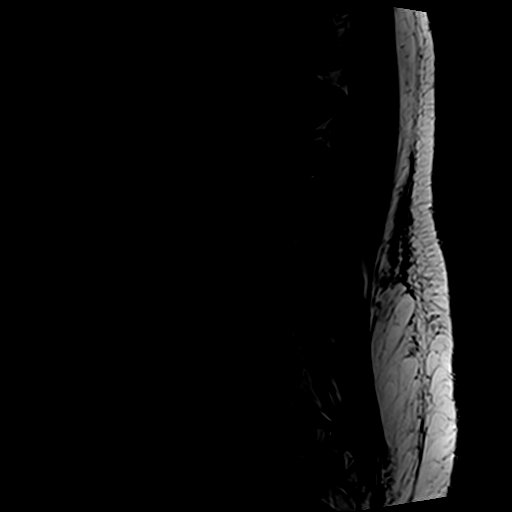
[im 6/12]
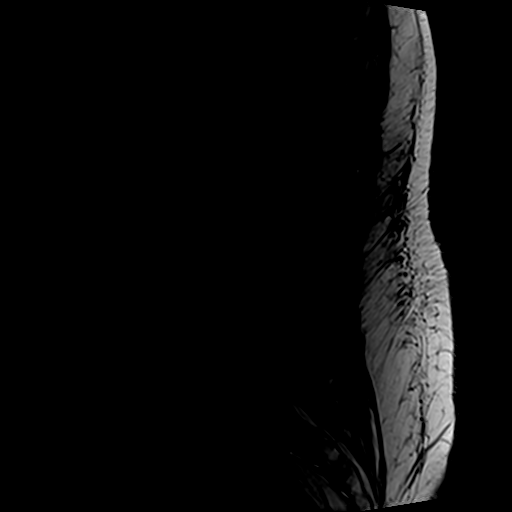
[im 9/12]
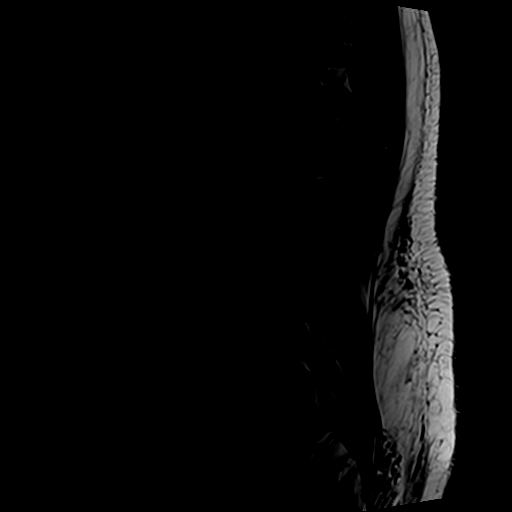
[im 12/12]
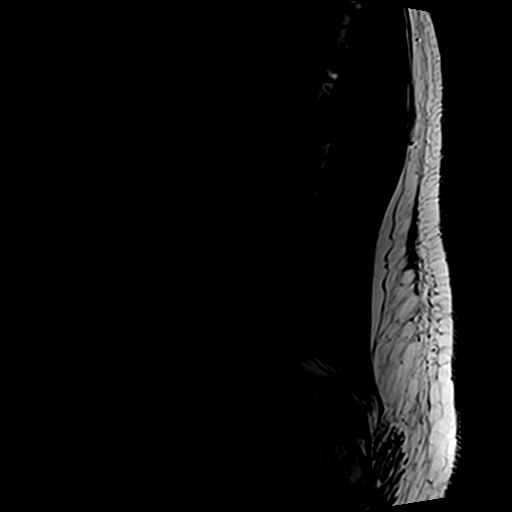

[Series 6: T2 · axial · 4.0mm · 0.70mm/px · z∈[+19,+193]mm · 10 of 35 slices shown (2 of 2)]
[im 3/35]
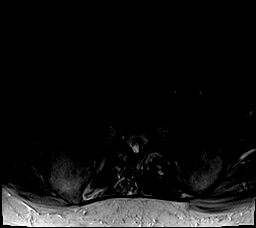
[im 5/35]
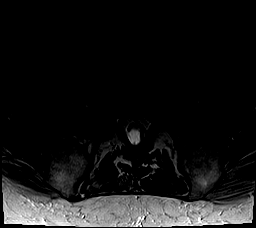
[im 7/35]
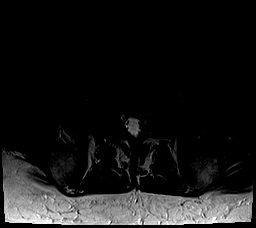
[im 12/35]
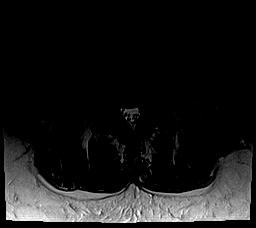
[im 16/35]
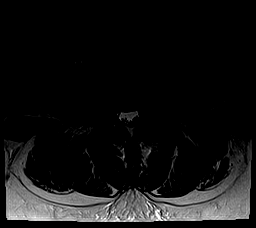
[im 19/35]
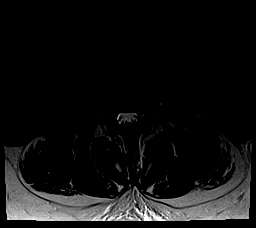
[im 21/35]
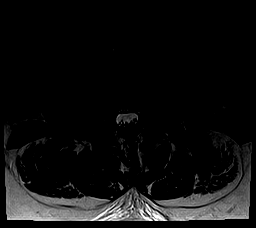
[im 25/35]
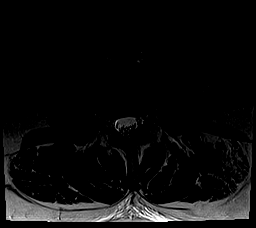
[im 30/35]
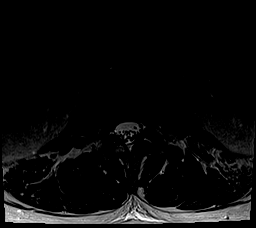
[im 35/35]
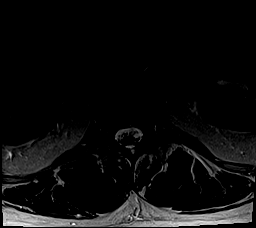

[Series 7: T1 · axial · 4.0mm · 0.35mm/px · z∈[+19,+168]mm · 6 of 35 slices shown (2 of 2)]
[im 3/35]
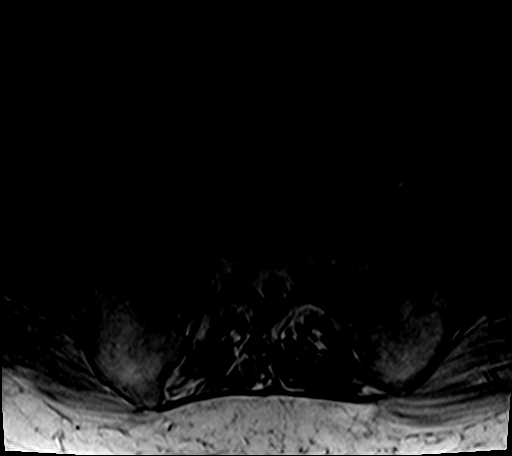
[im 5/35]
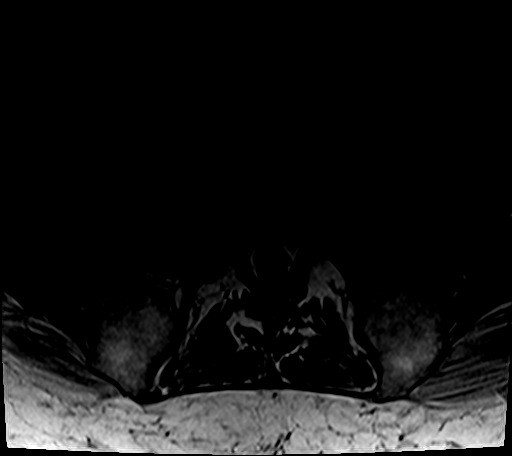
[im 7/35]
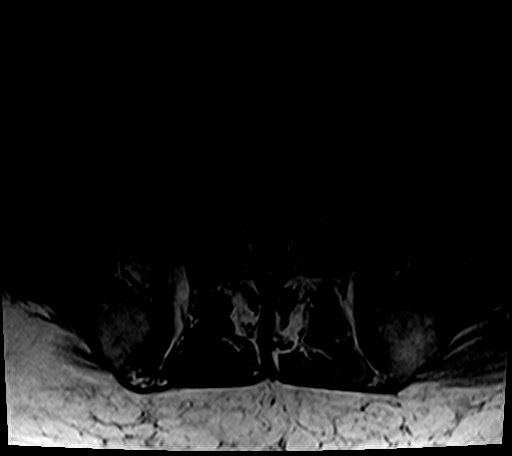
[im 12/35]
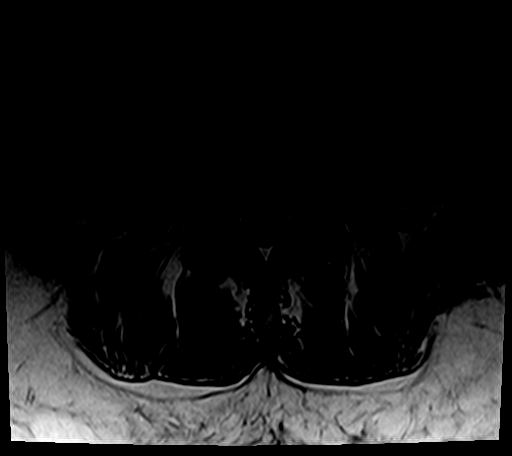
[im 19/35]
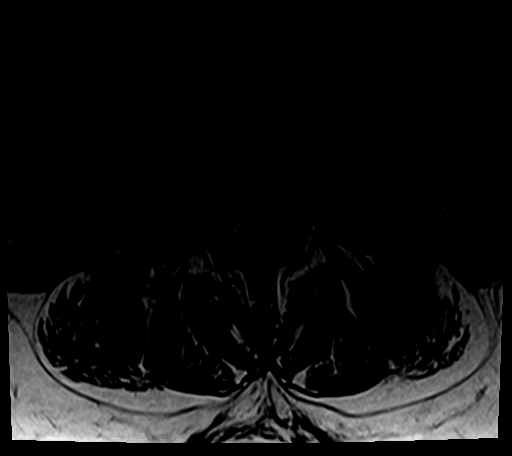
[im 30/35]
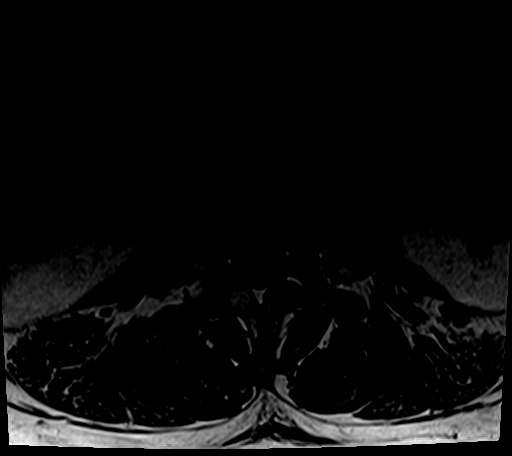

[27 of 48 positions shown; findings below may reference images not displayed]

FINDINGS: The vertebral bodies of the lumbar spine are normal in size and
alignment. There is normal bone marrow signal demonstrated
throughout the vertebra. The intervertebral disc spaces are
well-maintained.

The spinal cord is normal in signal and contour. The cord terminates
normally at L1 . The nerve roots of the cauda equina and the filum
terminale are normal.

Ankylosis of bilateral SI joints.

The imaged intra-abdominal contents are unremarkable.

T12-L1: No significant disc bulge. No evidence of neural foraminal
stenosis. No central canal stenosis.

L1-L2: No significant disc bulge. No evidence of neural foraminal
stenosis. No central canal stenosis.

L2-L3: No significant disc bulge. No evidence of neural foraminal
stenosis. No central canal stenosis.

L3-L4: Mild broad-based disc bulge. No evidence of neural foraminal
stenosis. No central canal stenosis.

L4-L5: Mild broad-based disc bulge. No evidence of neural foraminal
stenosis. No central canal stenosis.

L5-S1: Mild broad-based disc bulge. No evidence of neural foraminal
stenosis. No central canal stenosis.
IMPRESSION: 1. Mild broad-based disc bulge at L3-4, L4-5 L5-S1.

## 2024-02-05 DEATH — deceased
